# Patient Record
Sex: Female | Born: 1995 | Race: Black or African American | Hispanic: No | Marital: Single | Smoking: Former smoker
Health system: Southern US, Community
[De-identification: ages and names within clinical notes are randomized; demographics above are authoritative.]

## PROBLEM LIST (undated history)

## (undated) DIAGNOSIS — L0293 Carbuncle, unspecified: Secondary | ICD-10-CM

## (undated) DIAGNOSIS — L409 Psoriasis, unspecified: Secondary | ICD-10-CM

## (undated) DIAGNOSIS — L0292 Furuncle, unspecified: Secondary | ICD-10-CM

## (undated) DIAGNOSIS — O139 Gestational [pregnancy-induced] hypertension without significant proteinuria, unspecified trimester: Secondary | ICD-10-CM

## (undated) HISTORY — PX: WISDOM TOOTH EXTRACTION: SHX21

## (undated) HISTORY — DX: Gestational (pregnancy-induced) hypertension without significant proteinuria, unspecified trimester: O13.9

---

## 2014-10-25 ENCOUNTER — Emergency Department (HOSPITAL_COMMUNITY)
Admission: EM | Admit: 2014-10-25 | Discharge: 2014-10-25 | Discharge status: Against Medical Advice | Payer: Self-pay | Attending: Emergency Medicine | Admitting: Emergency Medicine

## 2014-10-25 ENCOUNTER — Encounter (HOSPITAL_COMMUNITY): Payer: Self-pay | Admitting: Emergency Medicine

## 2014-10-25 DIAGNOSIS — S6992XA Unspecified injury of left wrist, hand and finger(s), initial encounter: Secondary | ICD-10-CM | POA: Insufficient documentation

## 2014-10-25 DIAGNOSIS — S6991XA Unspecified injury of right wrist, hand and finger(s), initial encounter: Secondary | ICD-10-CM | POA: Insufficient documentation

## 2014-10-25 DIAGNOSIS — Y9389 Activity, other specified: Secondary | ICD-10-CM | POA: Insufficient documentation

## 2014-10-25 DIAGNOSIS — Y92481 Parking lot as the place of occurrence of the external cause: Secondary | ICD-10-CM | POA: Insufficient documentation

## 2014-10-25 DIAGNOSIS — Y998 Other external cause status: Secondary | ICD-10-CM | POA: Insufficient documentation

## 2014-10-25 MED ORDER — OXYCODONE-ACETAMINOPHEN 5-325 MG PO TABS
1.0000 | ORAL_TABLET | Freq: Once | ORAL | Status: AC
  Administered 2014-10-25: 1 via ORAL
  Filled 2014-10-25: qty 1
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
## 2014-10-25 NOTE — ED Notes (Signed)
Pt's family showed up and they went outside to talk and pt never returned

## 2015-01-02 ENCOUNTER — Emergency Department (HOSPITAL_COMMUNITY)
Admission: EM | Admit: 2015-01-02 | Discharge: 2015-01-02 | Disposition: A | Discharge status: Home / Self Care | Payer: Medicaid Other | Attending: Emergency Medicine | Admitting: Emergency Medicine

## 2015-01-02 ENCOUNTER — Encounter (HOSPITAL_COMMUNITY): Payer: Self-pay | Admitting: *Deleted

## 2015-01-02 DIAGNOSIS — N760 Acute vaginitis: Secondary | ICD-10-CM | POA: Insufficient documentation

## 2015-01-02 DIAGNOSIS — R1030 Lower abdominal pain, unspecified: Secondary | ICD-10-CM

## 2015-01-02 DIAGNOSIS — Z3202 Encounter for pregnancy test, result negative: Secondary | ICD-10-CM | POA: Insufficient documentation

## 2015-01-02 DIAGNOSIS — B9689 Other specified bacterial agents as the cause of diseases classified elsewhere: Secondary | ICD-10-CM

## 2015-01-02 DIAGNOSIS — Z793 Long term (current) use of hormonal contraceptives: Secondary | ICD-10-CM | POA: Insufficient documentation

## 2015-01-02 LAB — COMPREHENSIVE METABOLIC PANEL
ALT: 22 U/L (ref 14–54)
AST: 27 U/L (ref 15–41)
Albumin: 3.5 g/dL (ref 3.5–5.0)
Alkaline Phosphatase: 79 U/L (ref 38–126)
Anion gap: 9 (ref 5–15)
BUN: 7 mg/dL (ref 6–20)
CHLORIDE: 105 mmol/L (ref 101–111)
CO2: 21 mmol/L — ABNORMAL LOW (ref 22–32)
CREATININE: 0.76 mg/dL (ref 0.44–1.00)
Calcium: 9.2 mg/dL (ref 8.9–10.3)
GFR calc Af Amer: >60 mL/min (ref >60)
GLUCOSE: 91 mg/dL (ref 65–99)
POTASSIUM: 4.3 mmol/L (ref 3.5–5.1)
Sodium: 135 mmol/L (ref 135–145)
Total Bilirubin: 0.3 mg/dL (ref 0.3–1.2)
Total Protein: 7.2 g/dL (ref 6.5–8.1)

## 2015-01-02 LAB — CBC WITH DIFFERENTIAL/PLATELET
BASOS PCT: 0 % (ref 0–1)
Basophils Absolute: 0 10*3/uL (ref 0.0–0.1)
EOS PCT: 1 % (ref 0–5)
Eosinophils Absolute: 0.1 10*3/uL (ref 0.0–0.7)
HEMATOCRIT: 38.1 % (ref 36.0–46.0)
HEMOGLOBIN: 12.5 g/dL (ref 12.0–15.0)
Lymphocytes Relative: 52 % — ABNORMAL HIGH (ref 12–46)
Lymphs Abs: 4.8 10*3/uL — ABNORMAL HIGH (ref 0.7–4.0)
MCH: 28.6 pg (ref 26.0–34.0)
MCHC: 32.8 g/dL (ref 30.0–36.0)
MCV: 87.2 fL (ref 78.0–100.0)
MONOS PCT: 10 % (ref 3–12)
Monocytes Absolute: 0.9 10*3/uL (ref 0.1–1.0)
Neutro Abs: 3.4 10*3/uL (ref 1.7–7.7)
Neutrophils Relative %: 37 % — ABNORMAL LOW (ref 43–77)
PLATELETS: 363 10*3/uL (ref 150–400)
RBC: 4.37 MIL/uL (ref 3.87–5.11)
RDW: 13.3 % (ref 11.5–15.5)
WBC: 9.3 10*3/uL (ref 4.0–10.5)

## 2015-01-02 LAB — URINALYSIS, ROUTINE W REFLEX MICROSCOPIC
BILIRUBIN URINE: NEGATIVE
GLUCOSE, UA: NEGATIVE mg/dL
Hgb urine dipstick: NEGATIVE
Ketones, ur: NEGATIVE mg/dL
LEUKOCYTES UA: NEGATIVE
NITRITE: NEGATIVE
Protein, ur: NEGATIVE mg/dL
SPECIFIC GRAVITY, URINE: 1.021 (ref 1.005–1.030)
UROBILINOGEN UA: 1 mg/dL (ref 0.0–1.0)
pH: 7.5 (ref 5.0–8.0)

## 2015-01-02 LAB — LIPASE, BLOOD: LIPASE: 23 U/L (ref 22–51)

## 2015-01-02 LAB — WET PREP, GENITAL
Trich, Wet Prep: NONE SEEN
Yeast Wet Prep HPF POC: NONE SEEN

## 2015-01-02 LAB — POC URINE PREG, ED: Preg Test, Ur: NEGATIVE

## 2015-01-02 MED ORDER — HYDROCODONE-ACETAMINOPHEN 5-325 MG PO TABS
1.0000 | ORAL_TABLET | Freq: Once | ORAL | Status: AC
  Administered 2015-01-02: 1 via ORAL
  Filled 2015-01-02: qty 1

## 2015-01-02 MED ORDER — METRONIDAZOLE 500 MG PO TABS: 500.0000 mg | ORAL_TABLET | Freq: Two times a day (BID) | ORAL | Status: DC

## 2015-01-02 MED ORDER — HYDROCODONE-ACETAMINOPHEN 5-325 MG PO TABS: 1.0000 | ORAL_TABLET | Freq: Four times a day (QID) | ORAL | Status: DC | PRN

## 2015-01-02 MED ORDER — NAPROXEN 500 MG PO TABS: 500.0000 mg | ORAL_TABLET | Freq: Two times a day (BID) | ORAL | Status: DC | PRN

## 2015-01-02 NOTE — ED Provider Notes (Signed)
CSN: [id number]     Arrival date & time 01/02/15  1814 History   First MD Initiated Contact with Patient 01/02/15 1904     Chief Complaint  Patient presents with  . Abdominal Pain     (Consider location/radiation/quality/duration/timing/severity/associated sxs/prior Treatment) HPI Comments: Lana FishBrionna Garden is a 19 y.o. healthy female who presents to the ED with complaints of sudden onset lower abdominal pain that began 2 hours prior to arrival. She reports that the pain is 7/10 sharp, in her lower abdomen, coming intermittently, nonradiating, worse with ambulation, and with no medications tried prior to arrival. She states that it occurred when she went from a seated position to a standing position. Overall it has improved since onset. She denies any fevers, chills, chest pain, shortness breath, nausea, vomiting, diarrhea, constipation, melena, hematochezia, obstipation, dysuria, hematuria, vaginal bleeding or discharge, flank pain, numbness, tingling, weakness, recent travel, suspicious food intake, NSAIDs, sick contacts, or recent antibiotics. She endorses intermittent alcohol use, last drink was on Saturday when she had a glass of wine. LMP was 5/3, she is on birth control. She essentially active with 2 female partners, unprotected. No recent sexual activity.  Patient is a 19 y.o. female presenting with abdominal pain. The history is provided by the patient. No language interpreter was used.  Abdominal Pain Pain location:  LLQ, RLQ and suprapubic Pain quality: sharp   Pain radiates to:  Does not radiate Pain severity:  Moderate Onset quality:  Sudden Duration:  2 hours Timing:  Intermittent Progression:  Improving Chronicity:  New Context: not recent sexual activity, not recent travel, not sick contacts and not suspicious food intake   Relieved by:  None tried Worsened by:  Movement Ineffective treatments:  None tried Associated symptoms: no chest pain, no chills, no constipation, no  diarrhea, no dysuria, no fever, no flatus, no hematemesis, no hematochezia, no hematuria, no melena, no nausea, no shortness of breath, no vaginal bleeding, no vaginal discharge and no vomiting   Risk factors: obesity     History reviewed. No pertinent past medical history. History reviewed. No pertinent past surgical history. No family history on file. History  Substance Use Topics  . Smoking status: Never Smoker   . Smokeless tobacco: Not on file  . Alcohol Use: No   OB History    No data available     Review of Systems  Constitutional: Negative for fever and chills.  Respiratory: Negative for shortness of breath.   Cardiovascular: Negative for chest pain.  Gastrointestinal: Positive for abdominal pain. Negative for nausea, vomiting, diarrhea, constipation, blood in stool, melena, hematochezia, flatus and hematemesis.  Genitourinary: Negative for dysuria, frequency, hematuria, flank pain, vaginal bleeding, vaginal discharge and menstrual problem.  Musculoskeletal: Negative for myalgias, back pain and arthralgias.  Skin: Negative for color change.  Allergic/Immunologic: Negative for immunocompromised state.  Neurological: Negative for weakness and numbness.  Psychiatric/Behavioral: Negative for confusion.   10 Systems reviewed and are negative for acute change except as noted in the HPI.    Allergies  Review of patient's allergies indicates no known allergies.  Home Medications   Prior to Admission medications   Medication Sig Start Date End Date Taking? Authorizing Provider  ibuprofen (ADVIL,MOTRIN) 200 MG tablet Take 200 mg by mouth every 6 (six) hours as needed for mild pain or moderate pain.   Yes Historical Provider, MD  TRINESSA, 28, 0.18/0.215/0.25 MG-35 MCG tablet Take 1 tablet by mouth daily. 12/19/14  Yes Historical Provider, MD   BP 110/78 mmHg  Pulse 89  Temp(Src) 98.2 F (36.8 C) (Oral)  Resp 16  Ht  (1.626 m)  Wt 229 lb (103.874 kg)  BMI 39.29 kg/m2   SpO2 100%  LMP 12/04/2014 Physical Exam  Constitutional: She is oriented to person, place, and time. Vital signs are normal. She appears well-developed and well-nourished.  Non-toxic appearance. No distress.  Afebrile, nontoxic, NAD. Laughing with her friends  HENT:  Head: Normocephalic and atraumatic.  Mouth/Throat: Oropharynx is clear and moist and mucous membranes are normal.  Eyes: Conjunctivae and EOM are normal. Right eye exhibits no discharge. Left eye exhibits no discharge.  Neck: Normal range of motion. Neck supple.  Cardiovascular: Normal rate, regular rhythm, normal heart sounds and intact distal pulses.  Exam reveals no gallop and no friction rub.   No murmur heard. Pulmonary/Chest: Effort normal and breath sounds normal. No respiratory distress. She has no decreased breath sounds. She has no wheezes. She has no rhonchi. She has no rales.  Abdominal: Soft. Normal appearance and bowel sounds are normal. She exhibits no distension. There is tenderness in the right lower quadrant, suprapubic area and left lower quadrant. There is no rigidity, no rebound, no guarding, no CVA tenderness, no tenderness at McBurney's point and negative Murphy's sign.    Soft, obese but nondistended, +BS throughout, with diffuse lower abd tenderness along pelvic brim, no r/g/r, neg murphy's, neg mcburney's, no CVA TTP   Genitourinary: Uterus normal. Pelvic exam was performed with patient supine. There is no rash, tenderness or lesion on the right labia. There is no rash, tenderness or lesion on the left labia. Cervix exhibits no motion tenderness, no discharge and no friability. Right adnexum displays no mass, no tenderness and no fullness. Left adnexum displays no mass, no tenderness and no fullness. No erythema, tenderness or bleeding in the vagina. Vaginal discharge found.  Chaperone present for exam. No rashes, lesions, or tenderness to external genitalia. No erythema, injury, or tenderness to vaginal  mucosa. Mild white vaginal discharge without bleeding within vaginal vault. No adnexal masses, tenderness, or fullness. No CMT, cervical friability, or discharge from cervical os. Uterus non-deviated, mobile, nonTTP, and without enlargement.    Musculoskeletal: Normal range of motion.  Neurological: She is alert and oriented to person, place, and time. She has normal strength. No sensory deficit.  Skin: Skin is warm, dry and intact. No rash noted.  Psychiatric: She has a normal mood and affect.  Nursing note and vitals reviewed.   ED Course  Procedures (including critical care time) Labs Review Labs Reviewed  WET PREP, GENITAL - Abnormal; Notable for the following:    Clue Cells Wet Prep HPF POC MANY (*)    WBC, Wet Prep HPF POC FEW (*)    All other components within normal limits  CBC WITH DIFFERENTIAL/PLATELET - Abnormal; Notable for the following:    Neutrophils Relative % 37 (*)    Lymphocytes Relative 52 (*)    Lymphs Abs 4.8 (*)    All other components within normal limits  COMPREHENSIVE METABOLIC PANEL - Abnormal; Notable for the following:    CO2 21 (*)    All other components within normal limits  URINALYSIS, ROUTINE W REFLEX MICROSCOPIC - Abnormal; Notable for the following:    APPearance CLOUDY (*)    All other components within normal limits  LIPASE, BLOOD  RPR  HIV ANTIBODY (ROUTINE TESTING)  POC URINE PREG, ED  GC/CHLAMYDIA PROBE AMP ([facility])    Imaging Review No results found.  EKG Interpretation None      MDM   Final diagnoses:  Lower abdominal pain  BV (bacterial vaginosis)    19 y.o. female here with lower abd pain since this afternoon at 5pm. No other symptoms. Sexually active, unprotected. On exam, tenderness diffusely along lower abdomen, near pelvic brim. No mcburney's point tenderness. Will perform pelvic exam, get labs, U/A and Upreg, and give pain meds then reassess.   8:35 PM Upreg neg. Lipase WNL. CBC w/diff reassuring and  unremarkable. CMP unremarkable. U/A clear. Pelvic exam with mild white discharge without CMT or pelvic tenderness. Pt is laughing and with reassuring exam, doubt need for imaging. Will await wet prep and see if empiric tx for GC/CT would be indicated based on WBC count there. Will reassess shortly.   9:18 PM Wet prep reveals clue cells c/w BV, few WBCs, doubt need for empiric tx of GC/CT. Will treat with flagyl now and await GC/Ct testing to return. Pt appears well and pain improved, doubt emergent causes of abd pain that need further work up at this time. Will give pain meds for home. Will have her f/up with PCP in 1wk. Strict return precautions given. I explained the diagnosis and have given explicit precautions to return to the ER including for any other new or worsening symptoms. The patient understands and accepts the medical plan as it's been dictated and I have answered their questions. Discharge instructions concerning home care and prescriptions have been given. The patient is STABLE and is discharged to home in good condition.  BP 124/55 mmHg  Pulse 85  Temp(Src) 98.2 F (36.8 C) (Oral)  Resp 16  Ht  (1.626 m)  Wt 229 lb (103.874 kg)  BMI 39.29 kg/m2  SpO2 99%  LMP 12/04/2014  Meds ordered this encounter  Medications  . TRINESSA, 28, 0.18/0.215/0.25 MG-35 MCG tablet    Sig: Take 1 tablet by mouth daily.    Refill:  1  . ibuprofen (ADVIL,MOTRIN) 200 MG tablet    Sig: Take 200 mg by mouth every 6 (six) hours as needed for mild pain or moderate pain.  Marland Kitchen HYDROcodone-acetaminophen (NORCO/VICODIN) 5-325 MG per tablet 1 tablet    Sig:   . metroNIDAZOLE (FLAGYL) 500 MG tablet    Sig: Take 1 tablet (500 mg total) by mouth 2 (two) times daily. One po bid x 7 days    Dispense:  14 tablet    Refill:  0    Order Specific Question:  Supervising Provider    Answer:  Hyacinth Meeker, BRIAN [[id number]]  . naproxen (NAPROSYN) 500 MG tablet    Sig: Take 1 tablet (500 mg total) by mouth 2 (two) times  daily as needed for mild pain, moderate pain or headache (TAKE WITH MEALS.).    Dispense:  20 tablet    Refill:  0    Order Specific Question:  Supervising Provider    Answer:  MILLER, BRIAN [[id number]]  . HYDROcodone-acetaminophen (NORCO) 5-325 MG per tablet    Sig: Take 1 tablet by mouth every 6 (six) hours as needed for severe pain.    Dispense:  6 tablet    Refill:  0    Order Specific Question:  Supervising Provider    Answer:  Eber Hong [[id number]]     Kimesha Claxton Camprubi-Soms, PA-C 01/02/15 2120  Donnetta Hutching, MD 01/04/15 1120

## 2015-01-02 NOTE — Discharge Instructions (Signed)
Your abdominal pain was evaluated today, it does not seem to be anything emergent at this time. You were found to have bacterial vaginosis, take flagyl as directed but DO NOT CONSUME ALCOHOL WHILE TAKING THIS MEDICATION. Stay well hydrated. Use naprosyn and norco as directed as needed for pain, but don't drive while taking norco. Follow up with your regular doctor in one week for recheck. Return to the ER for changes or worsening symptoms.  Abdominal (belly) pain can be caused by many things. Your caregiver performed an examination and possibly ordered blood/urine tests and imaging (CT scan, x-rays, ultrasound). Many cases can be observed and treated at home after initial evaluation in the emergency department. Even though you are being discharged home, abdominal pain can be unpredictable. Therefore, you need a repeated exam if your pain does not resolve, returns, or worsens. Most patients with abdominal pain don't have to be admitted to the hospital or have surgery, but serious problems like appendicitis and gallbladder attacks can start out as nonspecific pain. Many abdominal conditions cannot be diagnosed in one visit, so follow-up evaluations are very important. SEEK IMMEDIATE MEDICAL ATTENTION IF YOU DEVELOP ANY OF THE FOLLOWING SYMPTOMS:  The pain does not go away or becomes severe.   A temperature above 101 develops.   Repeated vomiting occurs (multiple episodes).   The pain becomes localized to portions of the abdomen. The right side could possibly be appendicitis. In an adult, the left lower portion of the abdomen could be colitis or diverticulitis.   Blood is being passed in stools or vomit (bright red or black tarry stools).   Return also if you develop chest pain, difficulty breathing, dizziness or fainting, or become confused, poorly responsive, or inconsolable (young children).  The constipation stays for more than 4 days.   There is belly (abdominal) or rectal pain.   You do not  seem to be getting better.     Abdominal Pain, Women Abdominal (stomach, pelvic, or belly) pain can be caused by many things. It is important to tell your doctor:  The location of the pain.  Does it come and go or is it present all the time?  Are there things that start the pain (eating certain foods, exercise)?  Are there other symptoms associated with the pain (fever, nausea, vomiting, diarrhea)? All of this is helpful to know when trying to find the cause of the pain. CAUSES   Stomach: virus or bacteria infection, or ulcer.  Intestine: appendicitis (inflamed appendix), regional ileitis (Crohn's disease), ulcerative colitis (inflamed colon), irritable bowel syndrome, diverticulitis (inflamed diverticulum of the colon), or cancer of the stomach or intestine.  Gallbladder disease or stones in the gallbladder.  Kidney disease, kidney stones, or infection.  Pancreas infection or cancer.  Fibromyalgia (pain disorder).  Diseases of the female organs:  Uterus: fibroid (non-cancerous) tumors or infection.  Fallopian tubes: infection or tubal pregnancy.  Ovary: cysts or tumors.  Pelvic adhesions (scar tissue).  Endometriosis (uterus lining tissue growing in the pelvis and on the pelvic organs).  Pelvic congestion syndrome (female organs filling up with blood just before the menstrual period).  Pain with the menstrual period.  Pain with ovulation (producing an egg).  Pain with an IUD (intrauterine device, birth control) in the uterus.  Cancer of the female organs.  Functional pain (pain not caused by a disease, may improve without treatment).  Psychological pain.  Depression. DIAGNOSIS  Your doctor will decide the seriousness of your pain by doing an examination.  Blood tests.  X-rays.  Ultrasound.  CT scan (computed tomography, special type of X-ray).  MRI (magnetic resonance imaging).  Cultures, for infection.  Barium enema (dye inserted in the large  intestine, to better view it with X-rays).  Colonoscopy (looking in intestine with a lighted tube).  Laparoscopy (minor surgery, looking in abdomen with a lighted tube).  Major abdominal exploratory surgery (looking in abdomen with a large incision). TREATMENT  The treatment will depend on the cause of the pain.   Many cases can be observed and treated at home.  Over-the-counter medicines recommended by your caregiver.  Prescription medicine.  Antibiotics, for infection.  Birth control pills, for painful periods or for ovulation pain.  Hormone treatment, for endometriosis.  Nerve blocking injections.  Physical therapy.  Antidepressants.  Counseling with a psychologist or psychiatrist.  Minor or major surgery. HOME CARE INSTRUCTIONS   Do not take laxatives, unless directed by your caregiver.  Take over-the-counter pain medicine only if ordered by your caregiver. Do not take aspirin because it can cause an upset stomach or bleeding.  Try a clear liquid diet (broth or water) as ordered by your caregiver. Slowly move to a bland diet, as tolerated, if the pain is related to the stomach or intestine.  Have a thermometer and take your temperature several times a day, and record it.  Bed rest and sleep, if it helps the pain.  Avoid sexual intercourse, if it causes pain.  Avoid stressful situations.  Keep your follow-up appointments and tests, as your caregiver orders.  If the pain does not go away with medicine or surgery, you may try:  Acupuncture.  Relaxation exercises (yoga, meditation).  Group therapy.  Counseling. SEEK MEDICAL CARE IF:   You notice certain foods cause stomach pain.  Your home care treatment is not helping your pain.  You need stronger pain medicine.  You want your IUD removed.  You feel faint or lightheaded.  You develop nausea and vomiting.  You develop a rash.  You are having side effects or an allergy to your medicine. SEEK  IMMEDIATE MEDICAL CARE IF:   Your pain does not go away or gets worse.  You have a fever.  Your pain is felt only in portions of the abdomen. The right side could possibly be appendicitis. The left lower portion of the abdomen could be colitis or diverticulitis.  You are passing blood in your stools (bright red or black tarry stools, with or without vomiting).  You have blood in your urine.  You develop chills, with or without a fever.  You pass out. MAKE SURE YOU:   Understand these instructions.  Will watch your condition.  Will get help right away if you are not doing well or get worse. Document Released: 05/26/2007 Document Revised: 12/13/2013 Document Reviewed: 06/15/2009 [facility] Patient Information 2015 [place], [place]. This information is not intended to replace advice given to you by your health care provider. Make sure you discuss any questions you have with your health care provider.  Bacterial Vaginosis Bacterial vaginosis is a vaginal infection that occurs when the normal balance of bacteria in the vagina is disrupted. It results from an overgrowth of certain bacteria. This is the most common vaginal infection in women of childbearing age. Treatment is important to prevent complications, especially in pregnant women, as it can cause a premature delivery. CAUSES  Bacterial vaginosis is caused by an increase in harmful bacteria that are normally present in smaller amounts in the vagina. Several different  kinds of bacteria can cause bacterial vaginosis. However, the reason that the condition develops is not fully understood. RISK FACTORS Certain activities or behaviors can put you at an increased risk of developing bacterial vaginosis, including:  Having a new sex partner or multiple sex partners.  Douching.  Using an intrauterine device (IUD) for contraception. Women do not get bacterial vaginosis from toilet seats, bedding, swimming pools, or contact with objects  around them. SIGNS AND SYMPTOMS  Some women with bacterial vaginosis have no signs or symptoms. Common symptoms include:  Grey vaginal discharge.  A fishlike odor with discharge, especially after sexual intercourse.  Itching or burning of the vagina and vulva.  Burning or pain with urination. DIAGNOSIS  Your health care provider will take a medical history and examine the vagina for signs of bacterial vaginosis. A sample of vaginal fluid may be taken. Your health care provider will look at this sample under a microscope to check for bacteria and abnormal cells. A vaginal pH test may also be done.  TREATMENT  Bacterial vaginosis may be treated with antibiotic medicines. These may be given in the form of a pill or a vaginal cream. A second round of antibiotics may be prescribed if the condition comes back after treatment.  HOME CARE INSTRUCTIONS   Only take over-the-counter or prescription medicines as directed by your health care provider.  If antibiotic medicine was prescribed, take it as directed. Make sure you finish it even if you start to feel better.  Do not have sex until treatment is completed.  Tell all sexual partners that you have a vaginal infection. They should see their health care provider and be treated if they have problems, such as a mild rash or itching.  Practice safe sex by using condoms and only having one sex partner. SEEK MEDICAL CARE IF:   Your symptoms are not improving after 3 days of treatment.  You have increased discharge or pain.  You have a fever. MAKE SURE YOU:   Understand these instructions.  Will watch your condition.  Will get help right away if you are not doing well or get worse. FOR MORE INFORMATION  Centers for Disease Control and Prevention, Division of STD Prevention: SolutionApps.co.zawww.cdc.gov/std American Sexual Health Association (ASHA): www.ashastd.org  Document Released: 07/29/2005 Document Revised: 05/19/2013 Document Reviewed:  03/10/2013 [facility] Patient Information 2015 [place]. This information is not intended to replace advice given to you by your health care provider. Make sure you discuss any questions you have with your health care provider.

## 2015-01-02 NOTE — ED Notes (Signed)
Pt states lower abdominal pain since yesterday am.  Describes pain as sharp and pain increases with ambulation.  Denies changes in bowel and bladder habits or vaginal discharge.

## 2015-01-03 LAB — GC/CHLAMYDIA PROBE AMP (~~LOC~~) NOT AT ARMC
CHLAMYDIA, DNA PROBE: NEGATIVE
Neisseria Gonorrhea: NEGATIVE

## 2015-01-03 LAB — HIV ANTIBODY (ROUTINE TESTING W REFLEX): HIV SCREEN 4TH GENERATION: NONREACTIVE

## 2015-01-03 LAB — RPR: RPR: NONREACTIVE

## 2015-03-05 ENCOUNTER — Emergency Department (HOSPITAL_COMMUNITY)
Admission: EM | Admit: 2015-03-05 | Discharge: 2015-03-05 | Disposition: A | Discharge status: Home / Self Care | Payer: Medicaid Other | Attending: Emergency Medicine | Admitting: Emergency Medicine

## 2015-03-05 ENCOUNTER — Encounter (HOSPITAL_COMMUNITY): Payer: Self-pay | Admitting: Nurse Practitioner

## 2015-03-05 DIAGNOSIS — E669 Obesity, unspecified: Secondary | ICD-10-CM | POA: Insufficient documentation

## 2015-03-05 DIAGNOSIS — L02411 Cutaneous abscess of right axilla: Secondary | ICD-10-CM

## 2015-03-05 DIAGNOSIS — Z79899 Other long term (current) drug therapy: Secondary | ICD-10-CM | POA: Insufficient documentation

## 2015-03-05 MED ORDER — LIDOCAINE HCL 2 % EX GEL
1.0000 "application " | Freq: Once | CUTANEOUS | Status: AC
  Administered 2015-03-05: 1 via TOPICAL
  Filled 2015-03-05: qty 20

## 2015-03-05 MED ORDER — LIDOCAINE HCL (PF) 1 % IJ SOLN
INTRAMUSCULAR | Status: AC
  Filled 2015-03-05: qty 5

## 2015-03-05 MED ORDER — LIDOCAINE HCL (PF) 1 % IJ SOLN
5.0000 mL | Freq: Once | INTRAMUSCULAR | Status: AC
  Administered 2015-03-05: 5 mL

## 2015-03-05 MED ORDER — LIDOCAINE HCL (PF) 1 % IJ SOLN
5.0000 mL | Freq: Once | INTRAMUSCULAR | Status: AC
  Administered 2015-03-05: 5 mL
  Filled 2015-03-05: qty 5

## 2015-03-05 MED ORDER — SULFAMETHOXAZOLE-TRIMETHOPRIM 800-160 MG PO TABS: 1.0000 | ORAL_TABLET | Freq: Two times a day (BID) | ORAL | Status: AC

## 2015-03-05 MED ORDER — OXYCODONE-ACETAMINOPHEN 5-325 MG PO TABS
1.0000 | ORAL_TABLET | Freq: Once | ORAL | Status: AC
  Administered 2015-03-05: 1 via ORAL
  Filled 2015-03-05: qty 1

## 2015-03-05 MED ORDER — HYDROCODONE-ACETAMINOPHEN 5-325 MG PO TABS: 1.0000 | ORAL_TABLET | Freq: Four times a day (QID) | ORAL | Status: DC | PRN

## 2015-03-05 NOTE — ED Notes (Addendum)
She c/o painful abscess under R armpit x 8 days.  Shes been taking BC powder with some pain relief

## 2015-03-05 NOTE — ED Provider Notes (Signed)
CSN: [id number]     Arrival date & time 03/05/15  1612 History  This chart was scribed for Kerrie Buffalo, NP working with Pricilla Loveless, MD by Placido Sou, ED Scribe. This patient was seen in room TR11C/TR11C and the patient's care was started at 4:48 PM.    Chief Complaint  Patient presents with  . Skin Problem   The history is provided by the patient. No language interpreter was used.    HPI Comments: Carly Atkins is a 19 y.o. female, with a hx of abscesses, who presents to the Emergency Department complaining of a point of swelling and moderate, constant, pain to her right underarm with onset 8 days ago. Pt notes that she typically gets abscesses 2x per year and she has been applying a heated compress to the affected area with no relief of her pain or discharge from the affected area. She denies taking any medications for the pain. She denies fever, chills, n/v.  History reviewed. No pertinent past medical history. History reviewed. No pertinent past surgical history. History reviewed. No pertinent family history. History  Substance Use Topics  . Smoking status: Never Smoker   . Smokeless tobacco: Not on file  . Alcohol Use: Yes   OB History    No data available     Review of Systems  Constitutional: Negative for fever and chills.  Gastrointestinal: Negative for nausea and vomiting.  Musculoskeletal: Positive for myalgias.       Abscess right axilla  Skin: Positive for color change.  All other systems reviewed and are negative.  Allergies  Review of patient's allergies indicates no known allergies.  Home Medications   Prior to Admission medications   Medication Sig Start Date End Date Taking? Authorizing Provider  HYDROcodone-acetaminophen (NORCO) 5-325 MG per tablet Take 1 tablet by mouth every 6 (six) hours as needed. 03/05/15   Lucah Petta Orlene Och, NP  ibuprofen (ADVIL,MOTRIN) 200 MG tablet Take 200 mg by mouth every 6 (six) hours as needed for mild pain or moderate pain.     Historical Provider, MD  metroNIDAZOLE (FLAGYL) 500 MG tablet Take 1 tablet (500 mg total) by mouth 2 (two) times daily. One po bid x 7 days 01/02/15   Mercedes Camprubi-Soms, PA-C  naproxen (NAPROSYN) 500 MG tablet Take 1 tablet (500 mg total) by mouth 2 (two) times daily as needed for mild pain, moderate pain or headache (TAKE WITH MEALS.). 01/02/15   Mercedes Camprubi-Soms, PA-C  sulfamethoxazole-trimethoprim (BACTRIM DS,SEPTRA DS) 800-160 MG per tablet Take 1 tablet by mouth 2 (two) times daily. 03/05/15 03/12/15  Malayzia Laforte Orlene Och, NP  TRINESSA, 28, 0.18/0.215/0.25 MG-35 MCG tablet Take 1 tablet by mouth daily. 12/19/14   Historical Provider, MD   BP 119/89 mmHg  Pulse 88  Temp(Src) 98.2 F (36.8 C) (Oral)  Resp 18  Ht  (1.626 m)  Wt 226 lb 1.6 oz (102.558 kg)  BMI 38.79 kg/m2  SpO2 100% Physical Exam  Constitutional: She is oriented to person, place, and time. No distress.  Obese  HENT:  Head: Normocephalic and atraumatic.  Mouth/Throat: Oropharynx is clear and moist.  Eyes: Conjunctivae and EOM are normal. Pupils are equal, round, and reactive to light.  Neck: Normal range of motion. Neck supple. No tracheal deviation present.  Cardiovascular: Normal rate.   Pulmonary/Chest: Breath sounds normal. No respiratory distress.  Abdominal: Soft.  Musculoskeletal: Normal range of motion.  Neurological: She is alert and oriented to person, place, and time.  Skin: Skin is warm  and dry.  4 cm raised area that is tender with fluctuance right axilla  Psychiatric: She has a normal mood and affect. Her behavior is normal.  Nursing note and vitals reviewed.   ED Course  INCISION AND DRAINAGE Date/Time: 03/05/2015 5:45 PM Performed by: Janne Napoleon Authorized by: Janne Napoleon Consent: Verbal consent obtained. Risks and benefits: risks, benefits and alternatives were discussed Consent given by: patient Patient understanding: patient states understanding of the procedure being  performed Required items: required blood products, implants, devices, and special equipment available Patient identity confirmed: verbally with patient Type: abscess Body area: upper extremity (right axilla) Anesthesia: local infiltration Local anesthetic: lidocaine 1% without epinephrine Anesthetic total: 5 ml Patient sedated: no Scalpel size: 11 Needle gauge: 22 Incision type: single straight Complexity: simple Drainage: purulent Drainage amount: moderate Wound treatment: wound left open Patient tolerance: Patient tolerated the procedure well with no immediate complications    DIAGNOSTIC STUDIES: Oxygen Saturation is 99% on RA, normal by my interpretation.    COORDINATION OF CARE: 4:51 PM Discussed treatment plan with pt at bedside and pt agreed to plan.  MDM  19 y.o. obese female with abscess to right axilla that has been worsening over the past 8 days. Stable for d/c without fever and does not appear toxic. Patient reports significant relief with I&D. She will return as needed for any problems. She will apply warm wet compresses to the area to help assist with continued drainage.   Final diagnoses:  Abscess of right axilla   I personally performed the services described in this documentation, which was scribed in my presence. The recorded information has been reviewed and is accurate.   [address] [place], [place] 03/06/15 1610  Pricilla Loveless, MD 03/08/15 [phone]

## 2015-03-05 NOTE — Discharge Instructions (Signed)
Apply warm wet compresses to the area to help the infection to continue to drain. Do not drive while taking the narcotic as it will make you sleepy.  Abscess An abscess is an infected area that contains a collection of pus and debris.It can occur in almost any part of the body. An abscess is also known as a furuncle or boil. CAUSES  An abscess occurs when tissue gets infected. This can occur from blockage of oil or sweat glands, infection of hair follicles, or a minor injury to the skin. As the body tries to fight the infection, pus collects in the area and creates pressure under the skin. This pressure causes pain. People with weakened immune systems have difficulty fighting infections and get certain abscesses more often.  SYMPTOMS Usually an abscess develops on the skin and becomes a painful mass that is red, warm, and tender. If the abscess forms under the skin, you may feel a moveable soft area under the skin. Some abscesses break open (rupture) on their own, but most will continue to get worse without care. The infection can spread deeper into the body and eventually into the bloodstream, causing you to feel ill.  DIAGNOSIS  Your caregiver will take your medical history and perform a physical exam. A sample of fluid may also be taken from the abscess to determine what is causing your infection. TREATMENT  Your caregiver may prescribe antibiotic medicines to fight the infection. However, taking antibiotics alone usually does not cure an abscess. Your caregiver may need to make a small cut (incision) in the abscess to drain the pus. In some cases, gauze is packed into the abscess to reduce pain and to continue draining the area. HOME CARE INSTRUCTIONS   Only take over-the-counter or prescription medicines for pain, discomfort, or fever as directed by your caregiver.  If you were prescribed antibiotics, take them as directed. Finish them even if you start to feel better.  If gauze is used,  follow your caregiver's directions for changing the gauze.  To avoid spreading the infection:  Keep your draining abscess covered with a bandage.  Wash your hands well.  Do not share personal care items, towels, or whirlpools with others.  Avoid skin contact with others.  Keep your skin and clothes clean around the abscess.  Keep all follow-up appointments as directed by your caregiver. SEEK MEDICAL CARE IF:   You have increased pain, swelling, redness, fluid drainage, or bleeding.  You have muscle aches, chills, or a general ill feeling.  You have a fever. MAKE SURE YOU:   Understand these instructions.  Will watch your condition.  Will get help right away if you are not doing well or get worse. Document Released: 05/08/2005 Document Revised: 01/28/2012 Document Reviewed: 10/11/2011 [facility] Patient Information 2015 [place], [place]. This information is not intended to replace advice given to you by your health care provider. Make sure you discuss any questions you have with your health care provider.

## 2015-03-08 ENCOUNTER — Emergency Department (HOSPITAL_COMMUNITY)
Admission: EM | Admit: 2015-03-08 | Discharge: 2015-03-09 | Disposition: A | Discharge status: Home / Self Care | Payer: Medicaid Other | Attending: Emergency Medicine | Admitting: Emergency Medicine

## 2015-03-08 ENCOUNTER — Encounter (HOSPITAL_COMMUNITY): Payer: Self-pay | Admitting: Emergency Medicine

## 2015-03-08 DIAGNOSIS — L0291 Cutaneous abscess, unspecified: Secondary | ICD-10-CM

## 2015-03-08 DIAGNOSIS — L02411 Cutaneous abscess of right axilla: Secondary | ICD-10-CM | POA: Insufficient documentation

## 2015-03-08 DIAGNOSIS — Z79899 Other long term (current) drug therapy: Secondary | ICD-10-CM | POA: Insufficient documentation

## 2015-03-08 DIAGNOSIS — L0390 Cellulitis, unspecified: Secondary | ICD-10-CM

## 2015-03-08 HISTORY — DX: Carbuncle, unspecified: L02.93

## 2015-03-08 HISTORY — DX: Furuncle, unspecified: L02.92

## 2015-03-08 LAB — CBC WITH DIFFERENTIAL/PLATELET
BASOS ABS: 0 10*3/uL (ref 0.0–0.1)
Basophils Relative: 0 % (ref 0–1)
Eosinophils Absolute: 0.2 10*3/uL (ref 0.0–0.7)
Eosinophils Relative: 2 % (ref 0–5)
HEMATOCRIT: 33.8 % — AB (ref 36.0–46.0)
Hemoglobin: 11.6 g/dL — ABNORMAL LOW (ref 12.0–15.0)
LYMPHS PCT: 22 % (ref 12–46)
Lymphs Abs: 1.8 10*3/uL (ref 0.7–4.0)
MCH: 29.3 pg (ref 26.0–34.0)
MCHC: 34.3 g/dL (ref 30.0–36.0)
MCV: 85.4 fL (ref 78.0–100.0)
MONOS PCT: 14 % — AB (ref 3–12)
Monocytes Absolute: 1.1 10*3/uL — ABNORMAL HIGH (ref 0.1–1.0)
NEUTROS PCT: 62 % (ref 43–77)
Neutro Abs: 5 10*3/uL (ref 1.7–7.7)
Platelets: 378 10*3/uL (ref 150–400)
RBC: 3.96 MIL/uL (ref 3.87–5.11)
RDW: 13.2 % (ref 11.5–15.5)
WBC: 8.1 10*3/uL (ref 4.0–10.5)

## 2015-03-08 LAB — I-STAT CG4 LACTIC ACID, ED: LACTIC ACID, VENOUS: 0.69 mmol/L (ref 0.5–2.0)

## 2015-03-08 MED ORDER — LIDOCAINE-EPINEPHRINE (PF) 2 %-1:200000 IJ SOLN
20.0000 mL | Freq: Once | INTRAMUSCULAR | Status: AC
  Administered 2015-03-08: 20 mL via INTRADERMAL
  Filled 2015-03-08: qty 20

## 2015-03-08 MED ORDER — ACETAMINOPHEN 325 MG PO TABS
975.0000 mg | ORAL_TABLET | Freq: Once | ORAL | Status: AC
  Administered 2015-03-08: 975 mg via ORAL
  Filled 2015-03-08: qty 3

## 2015-03-08 MED ORDER — SODIUM CHLORIDE 0.9 % IV BOLUS (SEPSIS)
2000.0000 mL | Freq: Once | INTRAVENOUS | Status: AC
  Administered 2015-03-08: 2000 mL via INTRAVENOUS

## 2015-03-08 MED ORDER — CLINDAMYCIN PHOSPHATE 600 MG/50ML IV SOLN
600.0000 mg | Freq: Once | INTRAVENOUS | Status: AC
  Administered 2015-03-08: 600 mg via INTRAVENOUS
  Filled 2015-03-08: qty 50

## 2015-03-08 NOTE — ED Provider Notes (Signed)
CSN: [id number]     Arrival date & time 03/08/15  2152 History  This chart was scribed for Carly Emery, PA-C, working with Dione Booze, MD by Chestine Spore, ED Scribe. The patient was seen in room TR03C/TR03C at 10:42 PM.    Chief Complaint  Patient presents with  . Abscess    The history is provided by the patient. No language interpreter was used.    Carly Atkins is a 19 y.o. female with a medical hx of recurrent abscess who presents to the Emergency Department complaining of abscess onset last week. Pt was seen in the ED for her symptoms and was Rx SeptraDS and Norco with no improvement of her symptoms. Pt is having associated symptoms of warmth and tactile fever, she had a single episode of nonbloody, nonbilious, no coffee-ground emesis today. She denies dysuria, hematuria, urinary frequency, abnormal vaginal discharge, focal abdominal pain, cough, shortness of breath, rhinorrhea. Patient states that she has frequent axillary abscesses. His never required hospitalizations or surgeries.   Past Medical History  Diagnosis Date  . Recurrent boils    History reviewed. No pertinent past surgical history. No family history on file. History  Substance Use Topics  . Smoking status: Never Smoker   . Smokeless tobacco: Not on file  . Alcohol Use: Yes   OB History    No data available     Review of Systems  Constitutional: Positive for fever.  Skin: Positive for color change.       Abscess under right armpit with warmth      Allergies  Review of patient's allergies indicates no known allergies.  Home Medications   Prior to Admission medications   Medication Sig Start Date End Date Taking? Authorizing Provider  cephALEXin (KEFLEX) 500 MG capsule Take 1 capsule (500 mg total) by mouth 4 (four) times daily. 03/09/15   Nahum Sherrer, PA-C  HYDROcodone-acetaminophen (NORCO) 5-325 MG per tablet Take 1 tablet by mouth every 6 (six) hours as needed. 03/05/15   Hope Orlene Och, NP   ibuprofen (ADVIL,MOTRIN) 200 MG tablet Take 200 mg by mouth every 6 (six) hours as needed for mild pain or moderate pain.    Historical Provider, MD  metroNIDAZOLE (FLAGYL) 500 MG tablet Take 1 tablet (500 mg total) by mouth 2 (two) times daily. One po bid x 7 days 01/02/15   Mercedes Camprubi-Soms, PA-C  naproxen (NAPROSYN) 500 MG tablet Take 1 tablet (500 mg total) by mouth 2 (two) times daily as needed for mild pain, moderate pain or headache (TAKE WITH MEALS.). 01/02/15   Mercedes Camprubi-Soms, PA-C  ondansetron (ZOFRAN) 4 MG tablet Take 1 tablet (4 mg total) by mouth every 8 (eight) hours as needed for nausea or vomiting. 03/09/15   Joni Reining Klani Caridi, PA-C  sulfamethoxazole-trimethoprim (BACTRIM DS,SEPTRA DS) 800-160 MG per tablet Take 1 tablet by mouth 2 (two) times daily. 03/05/15 03/12/15  Hope Orlene Och, NP  TRINESSA, 28, 0.18/0.215/0.25 MG-35 MCG tablet Take 1 tablet by mouth daily. 12/19/14   Historical Provider, MD   BP 129/70 mmHg  Pulse 110  Temp(Src) 99.1 F (37.3 C) (Oral)  Resp 18  Ht 5\' 4"  (1.626 m)  Wt 226 lb (102.513 kg)  BMI 38.77 kg/m2  SpO2 100%  LMP 02/09/2015 Physical Exam  Constitutional: She is oriented to person, place, and time. She appears well-developed and well-nourished. No distress.  HENT:  Head: Normocephalic and atraumatic.  Mouth/Throat: Oropharynx is clear and moist.  Eyes: Conjunctivae and EOM are normal. Pupils are  equal, round, and reactive to light.  Neck: Normal range of motion. Neck supple. No tracheal deviation present.  Cardiovascular: Normal rate, regular rhythm and intact distal pulses.   Pulmonary/Chest: Effort normal and breath sounds normal. No stridor. No respiratory distress. She has no wheezes. She has no rales.  Abdominal: Soft.  Musculoskeletal: Normal range of motion.  Neurological: She is alert and oriented to person, place, and time.  Skin: Skin is warm and dry.  6 cm fluctuant abscess with no active drainage to right axilla, moderate  amount of scant surrounding cellulitis.  Psychiatric: She has a normal mood and affect. Her behavior is normal.  Nursing note and vitals reviewed.   ED Course  Procedures (including critical care time) DIAGNOSTIC STUDIES: Oxygen Saturation is 99% on RA, nl by my interpretation.    COORDINATION OF CARE: 11:04 PM-Discussed treatment plan which includes I&D, IV abx, with pt at bedside and pt agreed to plan.    Labs Review Labs Reviewed  CBC WITH DIFFERENTIAL/PLATELET - Abnormal; Notable for the following:    Hemoglobin 11.6 (*)    HCT 33.8 (*)    Monocytes Relative 14 (*)    Monocytes Absolute 1.1 (*)    All other components within normal limits  CULTURE, BLOOD (ROUTINE X 2)  CULTURE, BLOOD (ROUTINE X 2)  I-STAT BETA HCG BLOOD, ED ([facility], [facility], [facility] ONLY)  I-STAT CG4 LACTIC ACID, ED    Imaging Review No results found.   EKG Interpretation None      MDM   Final diagnoses:  Abscess and cellulitis   Filed Vitals:   03/08/15 2205 03/09/15 0102  BP: 110/71 129/70  Pulse: 84 110  Temp: 102.4 F (39.1 C) 99.1 F (37.3 C)  TempSrc: Oral Oral  Resp: 22 18  Height: 5\' 4"  (1.626 m)   Weight: 226 lb (102.513 kg)   SpO2: 99% 100%    Medications  acetaminophen (TYLENOL) tablet 975 mg (975 mg Oral Given 03/08/15 2308)  sodium chloride 0.9 % bolus 2,000 mL (0 mLs Intravenous Stopped 03/09/15 0053)  clindamycin (CLEOCIN) IVPB 600 mg (0 mg Intravenous Stopped 03/09/15 0053)  lidocaine-EPINEPHrine (XYLOCAINE W/EPI) 2 %-1:200000 (PF) injection 20 mL (20 mLs Intradermal Given 03/08/15 2249)    Carly Atkins is a pleasant 19 y.o. female presenting with recurrent axillary abscess. Pt was seen an I and D'd several days ago but wound closed and pt became febrile today. I and D performed by PA Neva Seat with a large volume of drainage and Pt feels much better.  No symptoms suggestive of other source of fever. Blood work reassuring with normal lactic acid no leukocytosis. Offered patient  admission as she has failed outpatient treatment but she's declined. Wound is packed and she is to come back to the ED for any worsening symptoms and to have packing removed in 2 days. Patient is advised to continue to take the Bactrim and will add on Keflex, she did have an episode of emesis this morning but she's tolerating by mouth's now. Will add on Zofran as needed. Advised her to take Motrin every 4-6 hours for fever control. Patient will be bolused 2 L in the ED.  Evaluation does not show pathology that would require ongoing emergent intervention or inpatient treatment. Pt is hemodynamically stable and mentating appropriately. Discussed findings and plan with patient/guardian, who agrees with care plan. All questions answered. Return precautions discussed and outpatient follow up given.   New Prescriptions   CEPHALEXIN (KEFLEX) 500 MG CAPSULE  Take 1 capsule (500 mg total) by mouth 4 (four) times daily.   ONDANSETRON (ZOFRAN) 4 MG TABLET    Take 1 tablet (4 mg total) by mouth every 8 (eight) hours as needed for nausea or vomiting.   I personally performed the services described in this documentation, which was scribed in my presence. The recorded information has been reviewed and is accurate.   Carly Emery, PA-C 03/09/15 0120  Dione Booze, MD 03/09/15 [phone]

## 2015-03-08 NOTE — ED Notes (Signed)
Pt st's was seen here 4 days ago with abscess under right arm.  Pt had I&D at that time but area did not drain anymore.  Pt st's area started to get big again and painful, today pt has had elevated temp with nausea.

## 2015-03-08 NOTE — ED Notes (Signed)
Pt. reports  abscess at right axilla onset last week , seen here 3 days ago prescribed with SeptraDS and Norco with no improvement .

## 2015-03-08 NOTE — ED Provider Notes (Signed)
INCISION AND DRAINAGE Performed by: Dorthula Matas Consent: Verbal consent obtained. Risks and benefits: risks, benefits and alternatives were discussed Type: abscess  Body area: right axilla Anesthesia: local infiltration  Incision was made with a scalpel.  Local anesthetic: lidocaine 2 % with epinephrine  Anesthetic total: 4 ml  Complexity: complex Blunt dissection to break up loculations  Drainage: purulent  Drainage amount: 30 cc's  Packing material: 1/4 in iodoform gauze  Patient tolerance: Patient tolerated the procedure well with no immediate complications.  For HPI, ROS, PE, Plan/dispo please refer to [facility], PA-C note from this visit.    Carly Pel, PA-C 03/08/15 1610  Azalia Bilis, MD 03/09/15 [phone]

## 2015-03-09 MED ORDER — ONDANSETRON HCL 4 MG PO TABS: 4.0000 mg | ORAL_TABLET | Freq: Three times a day (TID) | ORAL | Status: DC | PRN

## 2015-03-09 MED ORDER — CEPHALEXIN 500 MG PO CAPS: 500.0000 mg | ORAL_CAPSULE | Freq: Four times a day (QID) | ORAL | Status: DC

## 2015-03-09 NOTE — Discharge Instructions (Signed)
For fever and pain control, please take Ibuprofen (also known as Motrin or Advil)  (this is normally 2 over the counter pills) every 6 hours. Take with food to minimize stomach irritation.  Continue to take the Bactrim and take Keflex in addition.  Take your antibiotics as directed and to completion. You should never have any leftover antibiotics! Push fluids and stay well hydrated.   Any antibiotic use can reduce the efficacy of hormonal birth control. Please use back up method of contraception.   If you see worsening signs of infection (warmth, redness, tenderness, pus, sharp increase in pain, fever, red streaking) immediately return to the emergency department.   Abscess Care After An abscess (also called a boil or furuncle) is an infected area that contains a collection of pus. Signs and symptoms of an abscess include pain, tenderness, redness, or hardness, or you may feel a moveable soft area under your skin. An abscess can occur anywhere in the body. The infection may spread to surrounding tissues causing cellulitis. A cut (incision) by the surgeon was made over your abscess and the pus was drained out. Gauze may have been packed into the space to provide a drain that will allow the cavity to heal from the inside outwards. The boil may be painful for 5 to 7 days. Most people with a boil do not have high fevers. Your abscess, if seen early, may not have localized, and may not have been lanced. If not, another appointment may be required for this if it does not get better on its own or with medications. HOME CARE INSTRUCTIONS   Only take over-the-counter or prescription medicines for pain, discomfort, or fever as directed by your caregiver.  When you bathe, soak and then remove gauze or iodoform packs at least daily or as directed by your caregiver. You may then wash the wound gently with mild soapy water. Repack with gauze or do as your caregiver directs. SEEK IMMEDIATE MEDICAL CARE IF:     You develop increased pain, swelling, redness, drainage, or bleeding in the wound site.  You develop signs of generalized infection including muscle aches, chills, fever, or a general ill feeling.  An oral temperature above 102 F (38.9 C) develops, not controlled by medication. See your caregiver for a recheck if you develop any of the symptoms described above. If medications (antibiotics) were prescribed, take them as directed. Document Released: 02/14/2005 Document Revised: 10/21/2011 Document Reviewed: 10/12/2007 [facility] Patient Information 2015 [place], [place]. This information is not intended to replace advice given to you by your health care provider. Make sure you discuss any questions you have with your health care provider.

## 2015-03-10 ENCOUNTER — Encounter (HOSPITAL_COMMUNITY): Payer: Self-pay | Admitting: Emergency Medicine

## 2015-03-10 ENCOUNTER — Emergency Department (HOSPITAL_COMMUNITY)
Admission: EM | Admit: 2015-03-10 | Discharge: 2015-03-10 | Disposition: A | Discharge status: Home / Self Care | Payer: Medicaid Other | Attending: Emergency Medicine | Admitting: Emergency Medicine

## 2015-03-10 DIAGNOSIS — Z5189 Encounter for other specified aftercare: Secondary | ICD-10-CM

## 2015-03-10 DIAGNOSIS — Z79899 Other long term (current) drug therapy: Secondary | ICD-10-CM | POA: Insufficient documentation

## 2015-03-10 DIAGNOSIS — Z872 Personal history of diseases of the skin and subcutaneous tissue: Secondary | ICD-10-CM | POA: Insufficient documentation

## 2015-03-10 DIAGNOSIS — Z4801 Encounter for change or removal of surgical wound dressing: Secondary | ICD-10-CM | POA: Insufficient documentation

## 2015-03-10 MED ORDER — PROMETHAZINE HCL 25 MG PO TABS: 25.0000 mg | ORAL_TABLET | Freq: Four times a day (QID) | ORAL | Status: DC | PRN

## 2015-03-10 NOTE — ED Notes (Signed)
Pt. is here for removal of I and D packing at her right axilla .

## 2015-03-10 NOTE — Discharge Instructions (Signed)
1. Medications: phenergan for nausea and vomiting, usual home medications 2. Treatment: rest, drink plenty of fluids, use warm compresses,  3. Follow Up: Please followup here in the ED in 2-3 days for packing removal and wound check; Please return sooner to the ER for fevers, chills, nausea, vomiting or other signs of worsening infection

## 2015-03-10 NOTE — ED Provider Notes (Signed)
CSN: [id number]     Arrival date & time 03/10/15  2255 History  This chart was scribed for Carly Forth, PA-C, working with Blake Divine, MD by Chestine Spore, ED Scribe. The patient was seen in room TR05C/TR05C at 11:22 PM.    Chief Complaint  Patient presents with  . Wound Check      The history is provided by the patient and medical records. No language interpreter was used.    Carly Atkins is a 19 y.o. female with a medical hx of recurrent boils who presents to the Emergency Department complaining of wound check. Pt notes that she had an abscess to her right axilla that was I&D on 03/05/15 and again 03/08/15. Pt reports that she has still has a large amount of drainage from the site but the swelling has gone down. She denies fevers at home.  Pt reports that she is now able to lift up her right arm without significant pain. Pt states that she changed her gauze twice today because of the drainage. Pt reports that the abx that she is on is causing vomiting and she is very nauseous. She reports she is taking it on an empty stomach.  She denies fever, chills, vomiting, and any other symptoms. Pt notes that she is allergic to zofran and her skin turns red.    Past Medical History  Diagnosis Date  . Recurrent boils    History reviewed. No pertinent past surgical history. No family history on file. History  Substance Use Topics  . Smoking status: Never Smoker   . Smokeless tobacco: Not on file  . Alcohol Use: Yes   OB History    No data available     Review of Systems  Constitutional: Negative for fever and chills.  Gastrointestinal: Negative for nausea and vomiting.  Endocrine: Negative for polydipsia, polyphagia and polyuria.  Skin: Positive for wound.       Abscess  Allergic/Immunologic: Negative for immunocompromised state.  Hematological: Does not bruise/bleed easily.  Psychiatric/Behavioral: The patient is not nervous/anxious.       Allergies  Zofran  Home  Medications   Prior to Admission medications   Medication Sig Start Date End Date Taking? Authorizing Provider  cephALEXin (KEFLEX) 500 MG capsule Take 1 capsule (500 mg total) by mouth 4 (four) times daily. 03/09/15   Nicole Pisciotta, PA-C  HYDROcodone-acetaminophen (NORCO) 5-325 MG per tablet Take 1 tablet by mouth every 6 (six) hours as needed. 03/05/15   Hope Orlene Och, NP  ibuprofen (ADVIL,MOTRIN) 200 MG tablet Take 200 mg by mouth every 6 (six) hours as needed for mild pain or moderate pain.    Historical Provider, MD  metroNIDAZOLE (FLAGYL) 500 MG tablet Take 1 tablet (500 mg total) by mouth 2 (two) times daily. One po bid x 7 days 01/02/15   Mercedes Camprubi-Soms, PA-C  naproxen (NAPROSYN) 500 MG tablet Take 1 tablet (500 mg total) by mouth 2 (two) times daily as needed for mild pain, moderate pain or headache (TAKE WITH MEALS.). 01/02/15   Mercedes Camprubi-Soms, PA-C  ondansetron (ZOFRAN) 4 MG tablet Take 1 tablet (4 mg total) by mouth every 8 (eight) hours as needed for nausea or vomiting. 03/09/15   Joni Reining Pisciotta, PA-C  promethazine (PHENERGAN) 25 MG tablet Take 1 tablet (25 mg total) by mouth every 6 (six) hours as needed for nausea or vomiting. 03/10/15   Dahlia Client Drake Wuertz, PA-C  sulfamethoxazole-trimethoprim (BACTRIM DS,SEPTRA DS) 800-160 MG per tablet Take 1 tablet by mouth 2 (two)  times daily. 03/05/15 03/12/15  Hope Orlene Och, NP  TRINESSA, 28, 0.18/0.215/0.25 MG-35 MCG tablet Take 1 tablet by mouth daily. 12/19/14   Historical Provider, MD   BP 121/84 mmHg  Pulse 93  Temp(Src) 99.1 F (37.3 C) (Oral)  Resp 20  Ht 5' 4.5" (1.638 m)  Wt 223 lb (101.152 kg)  BMI 37.70 kg/m2  SpO2 98%  LMP 02/09/2015 Physical Exam  Constitutional: She is oriented to person, place, and time. She appears well-developed and well-nourished. No distress.  HENT:  Head: Normocephalic and atraumatic.  Eyes: Conjunctivae are normal. No scleral icterus.  Neck: Normal range of motion.  Cardiovascular:  Normal rate, regular rhythm, normal heart sounds and intact distal pulses.   No murmur heard. Pulmonary/Chest: Effort normal and breath sounds normal.  Abdominal: Soft. She exhibits no distension. There is no tenderness.  Lymphadenopathy:    She has no cervical adenopathy.  Neurological: She is alert and oriented to person, place, and time.  Skin: Skin is warm and dry. She is not diaphoretic. There is erythema.  Improvement in erythema and induration to the right axilla. Packing removed with continued moderate amount of purulent drainage.   Psychiatric: She has a normal mood and affect.  Nursing note and vitals reviewed.   ED Course  Procedures (including critical care time) DIAGNOSTIC STUDIES: Oxygen Saturation is 98% on RA, nl by my interpretation.    COORDINATION OF CARE: 11:31 PM-Discussed treatment plan with pt at bedside and pt agreed to plan.   Labs Review Labs Reviewed - No data to display  Imaging Review No results found.   EKG Interpretation None      MDM   Final diagnoses:  Wound check, abscess   Carly Atkins presents for abscess wound check.  Patient has had improvement in her erythema and induration however upon packing removal a moderate amount of purulent drainage remains. Will replace packing for continued drainage and have her return to the emergency department in 2 days for repeat wound check and packing removal. Patient complaining of nausea and vomiting after taking her antibiotics on an empty stomach. Recommend eating first. Patient also given Phenergan to help prevent vomiting she has an allergy to Zofran.  Patient temperature 99.1 degrees here in the emergency department however 2 days ago she was febrile to 102. Patient reports she feels much better and is without fevers at home.  BP 121/84 mmHg  Pulse 93  Temp(Src) 99.1 F (37.3 C) (Oral)  Resp 20  Ht 5' 4.5" (1.638 m)  Wt 223 lb (101.152 kg)  BMI 37.70 kg/m2  SpO2 98%  LMP 02/09/2015  I  personally performed the services described in this documentation, which was scribed in my presence. The recorded information has been reviewed and is accurate.   Dahlia Client Naithan Delage, PA-C 03/10/15 2352  Blake Divine, MD 03/11/15 [phone]

## 2015-03-14 LAB — CULTURE, BLOOD (ROUTINE X 2)
CULTURE: NO GROWTH
Culture: NO GROWTH

## 2015-03-15 ENCOUNTER — Emergency Department (HOSPITAL_COMMUNITY)
Admission: EM | Admit: 2015-03-15 | Discharge: 2015-03-15 | Disposition: A | Discharge status: Home / Self Care | Payer: Medicaid Other | Attending: Emergency Medicine | Admitting: Emergency Medicine

## 2015-03-15 ENCOUNTER — Encounter (HOSPITAL_COMMUNITY): Payer: Self-pay | Admitting: Family Medicine

## 2015-03-15 DIAGNOSIS — Z793 Long term (current) use of hormonal contraceptives: Secondary | ICD-10-CM | POA: Insufficient documentation

## 2015-03-15 DIAGNOSIS — Z4801 Encounter for change or removal of surgical wound dressing: Secondary | ICD-10-CM | POA: Insufficient documentation

## 2015-03-15 DIAGNOSIS — Z872 Personal history of diseases of the skin and subcutaneous tissue: Secondary | ICD-10-CM | POA: Insufficient documentation

## 2015-03-15 DIAGNOSIS — Z5189 Encounter for other specified aftercare: Secondary | ICD-10-CM

## 2015-03-15 DIAGNOSIS — Z792 Long term (current) use of antibiotics: Secondary | ICD-10-CM | POA: Insufficient documentation

## 2015-03-15 NOTE — ED Notes (Signed)
Pt here for packing removal.

## 2015-03-15 NOTE — ED Provider Notes (Signed)
History  This chart was scribed for non-physician practitioner, NicolWynetta EmeryPA-C,working with Lyndal Pulley, MD, by Karle Plumber, ED Scribe. This patient was seen in room TR06C/TR06C and the patient's care was started at 6:05 PM.  Chief Complaint  Patient presents with  . Wound Check   The history is provided by the patient and medical records. No language interpreter was used.    HPI Comments:  Carly Atkins is a 19 y.o. female who presents to the Emergency Department needing a wound check from having an abscess of the right axilla incised and drained one week ago. She states she returned to have the packing removed four days ago and it was repacked. She returns today to have the packing removed. She states the wound has been draining but she states it is lessening. She denies modifying factors. She denies bleeding, fever, chills, nausea, vomiting, warmth, redness or pain.   Past Medical History  Diagnosis Date  . Recurrent boils    History reviewed. No pertinent past surgical history. History reviewed. No pertinent family history. History  Substance Use Topics  . Smoking status: Never Smoker   . Smokeless tobacco: Not on file  . Alcohol Use: Yes   OB History    No data available     Review of Systems A complete 10 system review of systems was obtained and all systems are negative except as noted in the HPI and PMH.   Allergies  Zofran  Home Medications   Prior to Admission medications   Medication Sig Start Date End Date Taking? Authorizing Provider  cephALEXin (KEFLEX) 500 MG capsule Take 1 capsule (500 mg total) by mouth 4 (four) times daily. 03/09/15   Shauntee Karp, PA-C  HYDROcodone-acetaminophen (NORCO) 5-325 MG per tablet Take 1 tablet by mouth every 6 (six) hours as needed. 03/05/15   Hope Orlene Och, NP  ibuprofen (ADVIL,MOTRIN) 200 MG tablet Take 200 mg by mouth every 6 (six) hours as needed for mild pain or moderate pain.    Historical Provider, MD   metroNIDAZOLE (FLAGYL) 500 MG tablet Take 1 tablet (500 mg total) by mouth 2 (two) times daily. One po bid x 7 days 01/02/15   Mercedes Camprubi-Soms, PA-C  naproxen (NAPROSYN) 500 MG tablet Take 1 tablet (500 mg total) by mouth 2 (two) times daily as needed for mild pain, moderate pain or headache (TAKE WITH MEALS.). 01/02/15   Mercedes Camprubi-Soms, PA-C  ondansetron (ZOFRAN) 4 MG tablet Take 1 tablet (4 mg total) by mouth every 8 (eight) hours as needed for nausea or vomiting. 03/09/15   Joni Reining Leilynn Pilat, PA-C  promethazine (PHENERGAN) 25 MG tablet Take 1 tablet (25 mg total) by mouth every 6 (six) hours as needed for nausea or vomiting. 03/10/15   Hannah Muthersbaugh, PA-C  TRINESSA, 28, 0.18/0.215/0.25 MG-35 MCG tablet Take 1 tablet by mouth daily. 12/19/14   Historical Provider, MD   Triage Vitals: BP 107/83 mmHg  Pulse 80  Temp(Src) 98.2 F (36.8 C) (Oral)  Resp 18  Wt 221 lb (100.245 kg)  SpO2 98%  LMP 02/09/2015 Physical Exam  Constitutional: She is oriented to person, place, and time. She appears well-developed and well-nourished.  HENT:  Head: Normocephalic and atraumatic.  Eyes: EOM are normal.  Neck: Normal range of motion.  Cardiovascular: Normal rate.   Pulmonary/Chest: Effort normal.  Musculoskeletal: Normal range of motion.  Neurological: She is alert and oriented to person, place, and time.  Skin: Skin is warm and dry.  Packing in place to  axilla on right side. No surrounding cellulitis. No significant pain or warmth.  Packing is removed and there is slight purulent discharge.  Psychiatric: She has a normal mood and affect. Her behavior is normal.  Nursing note and vitals reviewed.   ED Course  Procedures (including critical care time) DIAGNOSTIC STUDIES: Oxygen Saturation is 98% on RA, normal by my interpretation.   COORDINATION OF CARE: 6:09 PM- Will remove packing and dress wound. Pt verbalizes understanding and agrees to plan.  Medications - No data to  display  Labs Review Labs Reviewed - No data to display  Imaging Review No results found.   EKG Interpretation None      MDM   Final diagnoses:  Wound check, abscess   Filed Vitals:   03/15/15 1721 03/15/15 1826  BP: 107/83 138/91  Pulse: 80 73  Temp: 98.2 F (36.8 C)   TempSrc: Oral   Resp: 18 18  Weight: 221 lb (100.245 kg)   SpO2: 98% 95%     Carly Atkins is a pleasant 19 y.o. female presenting for packing removal. Packing removed and patient is advised to aggressively irrigate the wound morning and night. It had an extensive discussion of return precautions patient verbalizes her understanding.  Evaluation does not show pathology that would require ongoing emergent intervention or inpatient treatment. Pt is hemodynamically stable and mentating appropriately. Discussed findings and plan with patient/guardian, who agrees with care plan. All questions answered. Return precautions discussed and outpatient follow up given.    I personally performed the services described in this documentation, which was scribed in my presence. The recorded information has been reviewed and is accurate.    [name], PA-C 03/15/15 2307  Lyndal Pulley, MD 03/16/15 [phone]

## 2015-03-15 NOTE — Discharge Instructions (Signed)
Do not hesitate to return to the emergency room for any new, worsening or concerning symptoms. ° °Please obtain primary care using resource guide below. Let them know that you were seen in the emergency room and that they will need to obtain records for further outpatient management. ° ° ° °Emergency Department Resource Guide °1) Find a Doctor and Pay Out of Pocket °Although you won't have to find out who is covered by your insurance plan, it is a good idea to ask around and get recommendations. You will then need to call the office and see if the doctor you have chosen will accept you as a new patient and what types of options they offer for patients who are self-pay. Some doctors offer discounts or will set up payment plans for their patients who do not have insurance, but you will need to ask so you aren't surprised when you get to your appointment. ° °2) Contact Your Local Health Department °Not all health departments have doctors that can see patients for sick visits, but many do, so it is worth a call to see if yours does. If you don't know where your local health department is, you can check in your phone book. The CDC also has a tool to help you locate your state's health department, and many state websites also have listings of all of their local health departments. ° °3) Find a Walk-in Clinic °If your illness is not likely to be very severe or complicated, you may want to try a walk in clinic. These are popping up all over the country in pharmacies, drugstores, and shopping centers. They're usually staffed by nurse practitioners or physician assistants that have been trained to treat common illnesses and complaints. They're usually fairly quick and inexpensive. However, if you have serious medical issues or chronic medical problems, these are probably not your best option. ° °No Primary Care Doctor: °- Call [facility] at  832-8000 - they can help you locate a primary care doctor that  accepts your  insurance, provides certain services, etc. °- Physician Referral Service- 1-800-533-3463 ° °Chronic Pain Problems: °Organization         Address  Phone   Notes  °[facility]  (336) 297-2271 Patients need to be referred by their primary care doctor.  ° °Medication Assistance: °Organization         Address  Phone   Notes  °[place] [facility] [address] °[place], [place] [address] (336) 641-8030 --Must be a resident of [place] °-- Must have NO insurance coverage whatsoever (no Medicaid/ Medicare, etc.) °-- The pt. MUST have a primary care doctor that directs their care regularly and follows them in the community °  °[facility]  (866) 331-1348   °[employer]  (888) 892-1162   ° °Agencies that provide inexpensive medical care: °Organization         Address  Phone   Notes  °[facility]  (336) 832-8035   °[facility]    (336) 832-7272   °[facility] [address] °[place], [place] [address] (336) 832-4777   °[facility] [address], °[place] (336) 271-4999   °[facility]    (336) 373-0678   °[facility]    (336) 272-1050   °[facility] ° [address], [place] Phone:  (336) 832-4444, Fax:  (336) 832-4440 Hours of Operation:  9 am -   6 pm, M-F.  Also accepts Medicaid/Medicare and self-pay.  °[facility] ° [address], [place] Phone: (336) 832-3150, Fax: (336) [phone]. Hours of Operation:  8:30 am - 5:30 pm, M-F.  Also accepts Medicaid and self-pay.  °[facility] [place] [address], [place] Phone: (336) 878-6027   °[facility] [address], [place], [place] (336)723-1848, Ext. 123 Mondays & Thursdays: 7-9 AM.  First 15 patients are seen on a first come, first serve basis. °  ° °Medicaid-accepting [place] Providers: ° °Organization          Address  Phone   Notes  °[facility] [address], [place] (336) 641-2100 Also accepts self-pay patients.  °[facility] [address], [place] ° (336) 856-9996   °[facility] [address], [place] (336) 288-8857   °[facility] [address] [place] [address], [place] (336) 299-7000   °Veita Bland [address], [place]  ° (336) 373-1557 Only accepts [place] Access Medicaid patients after they have their name applied to their card.  ° °Self-Pay (no insurance) in [place]: ° °Organization         Address  Phone   Notes  °Sickle Cell Patients, [facility] [address], [place] (336) 832-1970   °[facility] [address], [place] (336) 832-4400   °[facility]  ° [address],  (336) 992-4800   °[facility]/Dr. Osei-Bonsu ° [address] [place] [address], [place] or [address], [place] (336) [phone] Phone number for both [place] and [place] locations is the same.  °[facility] [address], [place] (336) 299-0000   °[facility] [address] [place] [address], [place] or [address] (336) 852-7530 °(336) 878-2260   °[facility] [address], [place] (336) 350-1642, phone; (336) 294-5005, fax Sees patients 1st and 3rd Saturday of every month.  Must not qualify for public or private insurance (i.e. Medicaid, Medicare, [place] Health Choice, Veterans' Benefits) • Household income should be no more than 200% of the poverty level •The clinic cannot treat you if you are pregnant or think you are pregnant • Sexually transmitted diseases are not treated at the clinic.  ° ° °Dental Care: °Organization         Address  Phone  Notes  °[facility] [address],   (336) 641-6152 Accepts children up to age 21 who are enrolled in Medicaid or [place] Health Choice; pregnant women with a Medicaid card; and children who have applied for Medicaid or [place] Health Choice, but were declined, whose parents can pay a reduced fee at time of service.  °[facility] [place]  [address], [place] (336) 641-7733 Accepts children up to age 21 who are enrolled in Medicaid or [place] Health Choice; pregnant women with a Medicaid card; and children who have applied for Medicaid or [place] Health Choice, but were declined, whose parents can pay a reduced fee at time of service.  °[facility] ° [address],  (336) 641-4533 Patients are seen by appointment only. Walk-ins are not accepted. [facility] will see patients 18 years of age and   older. °Monday - Tuesday (8am-5pm) °Most Wednesdays (8:30-5pm) °$30 per visit, cash only  °[facility] ° [address], [place] (336) 641-4533 Patients are seen by appointment only. Walk-ins are not accepted. [facility] will see patients 18 years of age and older. °One Wednesday Evening (Monthly: Volunteer Based).  $30 per visit, cash only  °[facility]  (919) 537-3737 for adults; Children under age 4, call Graduate Pediatric Dentistry at (919) [phone]. Children aged 4-14, please call (919) [phone] to request a pediatric application. ° Dental services are provided in all areas of dental care including fillings, crowns and bridges, complete and partial dentures, implants, gum treatment, root canals, and extractions. Preventive care is also provided. Treatment is provided to both adults and children. °Patients are selected via a lottery and there is often a waiting list. °  °[facility] [address], °[place] ° (336) 763-8833 [email or web address] °  °[facility] [address], [place], [place]  (336)723-1848, Ext. 123 Second and Fourth Thursday of each month, opens at 6:30 AM; Clinic ends at 9 AM.  Patients are seen on a first-come first-served basis, and a limited number are seen during each clinic.  ° °[facility] ° [address], [place], [place] (336) 723-7904   Eligibility Requirements °You must have lived in [place], [place], or [place] counties for at least the last three months. °  You cannot be eligible for state or federal sponsored healthcare insurance, including Veterans Administration, Medicaid, or Medicare. °  You generally cannot be eligible for healthcare insurance through your employer.  °  How to apply: °Eligibility screenings are held every Tuesday and Wednesday afternoon from 1:00 pm until 4:00 pm. You do not need an appointment for the interview!  °[facility] [address], [place], [place] 336-631-2330   °[facility]  336-342-8273   °[facility]  336-703-3100   °[facility]  336-570-6415   ° °Behavioral Health Resources in the Community: °Intensive Outpatient Programs °Organization         Address  Phone  Notes  °[facility] [address], [place], [place] 336-878-6098   °[facility] [address], [place], [place] 336-832-9800   °[facility] [address], [place], [place] ° 336-882-2125   °[facility] [address],  °[place], [place] 1-800-853-5163 or 336-641-4981   °Substance Abuse Resources °Organization         Address  Phone  Notes  °[facility]  336-882-2125   °[facility]  336-784-9470   °[facility]  336-285-9073   °[facility]  336-845-3988   °Residential & Outpatient Substance Abuse Program  1-800-659-3381   °Psychological Services °Organization         Address  Phone  Notes  °[facility]  336- 832-9600   °[facility]  336- 378-7881    °[facility] [address], [place] 1-800-853-5163 or 336-641-4981   ° °Mobile Crisis Teams °Organization         Address  Phone  Notes  °[facility], Mobile Crisis Care Unit  1-877-626-1772   °Assertive °[facility] ° [address] [place], [place] 336-834-9664   °Sharon DeEsch [address] ° [place] 336-554-5454   ° °Self-Help/Support Groups °Organization         Address    Phone             Notes  °[facility] [place] - variety of support groups  336- 373-1402 Call for more information  °Narcotics Anonymous (NA), [facility] [address], °[place]  2 meetings at this location  ° °Residential Treatment Programs °Organization         Address  Phone  Notes  °[facility] [address],    °[place]  1-866-801-8205   °[facility] ° [address], [place], [place] 704-293-8524   °[facility] [address], [place] 336-845-3988 Admissions: 8am-3pm M-F  °[facility] [address]    °[place], [place] 336-841-1104   °[facility] [address], [place], [place] 336-379-7146   °[facility] [address]  °[place], [place] 336-285-9073   °[facility] Intensive Outpatient [address], [place], [place] 336-852-3033   °[facility] [address]  °[place], [place] 1-877-615-2722 or 336-784-9470   °[facility] [address] [place], [place] 336-227-7417 Accepts Medicaid  °[facility] [address]  °[place] 1-800-659-3381 Substance Abuse/Addiction Treatment  ° °[place] Behavioral Health Resources °Organization         Address  Phone  Notes  °[facility]  (888) 581-9988   °Julie Brannon, PhD [address] [place], [place]   (336) 349-5553 or (336) 951-0000   °[facility]   [address] °[place], [place] (336) 349-4454   °[facility] [address], [place], [place] (336) 342-8316 Insurance/Medicaid/sponsorship through [facility]  °[facility] [address]                                    [place], [place] (336) 342-8316 Therapy/tele-psych/case  °[facility] [address]  ° [place], [place] (336) 349-2233    °Dr. Arfeen  (336) 349-4544   °[facility] [place]  [employer] [place] [facility] 1) [address],  °2) [address], [place] °3)  [address], [place] (336) 349-3220 °(336) 342-7768 ° °(336) 342-8140   °[place] Child Abuse [facility] (336) 342-1394 or (336) 342-3537 (After Hours)    ° ° ° °

## 2016-03-16 ENCOUNTER — Ambulatory Visit: Payer: Self-pay

## 2016-03-18 ENCOUNTER — Ambulatory Visit (INDEPENDENT_AMBULATORY_CARE_PROVIDER_SITE_OTHER): Payer: BLUE CROSS/BLUE SHIELD | Admitting: Physician Assistant

## 2016-03-18 VITALS — BP 132/84 | HR 91 | Temp 98.6°F | Resp 17 | Ht 64.5 in | Wt 215.0 lb

## 2016-03-18 DIAGNOSIS — R21 Rash and other nonspecific skin eruption: Secondary | ICD-10-CM

## 2016-03-18 DIAGNOSIS — Z202 Contact with and (suspected) exposure to infections with a predominantly sexual mode of transmission: Secondary | ICD-10-CM

## 2016-03-18 LAB — POCT URINE PREGNANCY: Preg Test, Ur: NEGATIVE

## 2016-03-18 LAB — POCT SKIN KOH: SKIN KOH, POC: NEGATIVE

## 2016-03-18 MED ORDER — FLUCONAZOLE 150 MG PO TABS: 300.0000 mg | ORAL_TABLET | ORAL | 0 refills | Status: AC

## 2016-03-18 MED ORDER — CEFTRIAXONE SODIUM 250 MG IJ SOLR
250.0000 mg | Freq: Once | INTRAMUSCULAR | Status: AC
  Administered 2016-03-18: 250 mg via INTRAMUSCULAR

## 2016-03-18 MED ORDER — TRIAMCINOLONE ACETONIDE 0.5 % EX OINT: 1.0000 "application " | TOPICAL_OINTMENT | Freq: Two times a day (BID) | CUTANEOUS | 0 refills | Status: DC

## 2016-03-18 NOTE — Patient Instructions (Addendum)
     IF you received an x-ray today, you will receive an invoice from [facility]. Please contact [facility] at [phone] with questions or concerns regarding your invoice.   IF you received labwork today, you will receive an invoice from [facility]. Please contact [facility] at [phone] with questions or concerns regarding your invoice.   Our billing staff will not be able to assist you with questions regarding bills from these companies.  You will be contacted with the lab results as soon as they are available. The fastest way to get your results is to activate your My Chart account. Instructions are located on the last page of this paperwork. If you have not heard from us regarding the results in 2 weeks, please contact this office.    Please try the diflucan by itself without the use of the prescribed ointment. I would like you to try to use the selenium sulfide (selsum blue) over the counter when you are bathing.  These have anti-fungal properties.  Let's give this about 2 weeks, if symptoms worsen--return for punch biopsy. Make sure that you are not using any new soaps or hygeine products that may be sparking an allergic reaction.

## 2016-03-18 NOTE — Progress Notes (Signed)
Patient ID: Carly Atkins, female   DOB: 02/18/1996, 20 y.o.   MRN: [id number] [facility] [address], [place] [id number] [phone]  By signing my name below I, Shelah LewandowskyJoseph Thomas, attest that this documentation has been prepared under the direction and in the presence of Trena PlattStephanie English PA. Electonically Signed. Shelah LewandowskyJoseph Thomas, Scribe 03/18/2016 at 6:28 PM   Date:  03/18/2016   Name:  Carly Atkins   DOB:  12/15/1995   MRN:  [id number]  PCP:  No primary care provider on file.    History of Present Illness:  Carly Atkins is a 20 y.o. female patient who [facility]D [facility]. 6 days ago, pt was contacted by sexual partner who tested positive for gonorrhea. Pt had unprotected sex with the gonorrhea positive partner 2 weeks ago. Pt takes her hormonal birth control faithfully. Pt's LNMP was 2 weeks ago. Pt denies any vaginal discharge, vaginal pain, or sore throat.  Pt also c/o painful bumps rt lower extremity. Pt states that she has had similar bumps in the past. Pt also reports bilat armpit irritation as well pruritic circular patches of skin on her anterior trunk and upper extremites. Pt states that the itchy patches are starting to spread to her sides and back. Pt states that she has tried using tea tree oil, cocoa butter, coconut oil, and anti-itch cream on skin with no relief.    There are no active problems to display for this patient.   Past Medical History:  Diagnosis Date   Recurrent boils     No past surgical history on file.  Social History  Substance Use Topics   Smoking status: Never Smoker   Smokeless tobacco: Not on file   Alcohol use Yes    No family history on file.  Allergies  Allergen Reactions   Zofran [Ondansetron Hcl] Rash    Medication list has been reviewed and updated.  Current Outpatient Prescriptions on File Prior to Visit  Medication Sig Dispense Refill   TRINESSA, 28, 0.18/0.215/0.25 MG-35 MCG  tablet Take 1 tablet by mouth daily.  1   cephALEXin (KEFLEX) 500 MG capsule Take 1 capsule (500 mg total) by mouth 4 (four) times daily. (Patient not taking: Reported on 03/18/2016) 20 capsule 0   HYDROcodone-acetaminophen (NORCO) 5-325 MG per tablet Take 1 tablet by mouth every 6 (six) hours as needed. (Patient not taking: Reported on 03/18/2016) 10 tablet 0   ibuprofen (ADVIL,MOTRIN) 200 MG tablet Take 200 mg by mouth every 6 (six) hours as needed for mild pain or moderate pain.     metroNIDAZOLE (FLAGYL) 500 MG tablet Take 1 tablet (500 mg total) by mouth 2 (two) times daily. One po bid x 7 days (Patient not taking: Reported on 03/18/2016) 14 tablet 0   naproxen (NAPROSYN) 500 MG tablet Take 1 tablet (500 mg total) by mouth 2 (two) times daily as needed for mild pain, moderate pain or headache (TAKE WITH MEALS.). (Patient not taking: Reported on 03/18/2016) 20 tablet 0   promethazine (PHENERGAN) 25 MG tablet Take 1 tablet (25 mg total) by mouth every 6 (six) hours as needed for nausea or vomiting. (Patient not taking: Reported on 03/18/2016) 12 tablet 0   No current facility-administered medications on file prior to visit.     ROS ROS unremarkable unless otherwise specified.  Physical Examination: BP 132/84 (BP Location: Right Arm, Patient Position: Sitting, Cuff Size: Normal)    Pulse 91    Temp 98.6  F (37 C) (Oral)    Resp 17    Ht 5' 4.5" (1.638 m)    Wt 215 lb (97.5 kg)    SpO2 100%    BMI 36.33 kg/m  Ideal Body Weight: ([id number])@  Physical Exam  Constitutional: She is oriented to person, place, and time. She appears well-developed and well-nourished. No distress.  HENT:  Head: Normocephalic and atraumatic.  Right Ear: External ear normal.  Left Ear: External ear normal.  Eyes: Conjunctivae and EOM are normal. Pupils are equal, round, and reactive to light.  Cardiovascular: Normal rate.   Pulmonary/Chest: Effort normal. No respiratory distress.  Neurological: She is alert  and oriented to person, place, and time.  Skin: She is not diaphoretic.  Pt has erythematous, tender nodules with central fluctuance on her rt lowe extremity. Pt also has anular, scaly, raised, dry skin patches measuring 1cm in diameter on her anterior trunk, traveling to her posterior trunk bilat, and on her upper extremities bilat.  Psychiatric: She has a normal mood and affect. Her behavior is normal.   Results for orders placed or performed in visit on 03/18/16  POCT Skin KOH  Result Value Ref Range   Skin KOH, POC Negative      Assessment and Plan: Rashanna Heier is a 20 y.o. female who is here today for rash on trunk, and lower extremity, and std exposure. Given rocephin shot at this time.  Will treat trunk with diflucan weekly for up to 4 weeks.  She will return in 2 weeks if no improvement.    Rash and nonspecific skin eruption - Plan: POCT Skin KOH, RPR, HIV antibody, POCT urine pregnancy, WOUND CULTURE, fluconazole (DIFLUCAN) 150 MG tablet  STD exposure - Plan: RPR, HIV antibody, POCT urine pregnancy, GC/Chlamydia Probe Amp, cefTRIAXone (ROCEPHIN) injection 250 mg  Exposure to gonorrhea - Plan: GC/Chlamydia Probe Amp, cefTRIAXone (ROCEPHIN) injection 250 mg   Trena Platt, PA-C [facility] 03/18/2016 5:24 PM   Addendum: given azithromycin for treatment of chlamydia.  Instructed of medication administration.  augmentin also added to treat staph aureus infection which is likely at the lower extremity only.  She states that the diflucan is already working along her trunk.

## 2016-03-19 LAB — HIV ANTIBODY (ROUTINE TESTING W REFLEX): HIV 1&2 Ab, 4th Generation: NONREACTIVE

## 2016-03-20 LAB — GC/CHLAMYDIA PROBE AMP
CT PROBE, AMP APTIMA: DETECTED — AB
GC Probe RNA: DETECTED — AB

## 2016-03-20 LAB — RPR

## 2016-03-21 LAB — WOUND CULTURE: Gram Stain: NONE SEEN

## 2016-03-25 MED ORDER — AMOXICILLIN-POT CLAVULANATE 875-125 MG PO TABS: 1.0000 | ORAL_TABLET | Freq: Two times a day (BID) | ORAL | 0 refills | Status: AC

## 2016-03-25 MED ORDER — AZITHROMYCIN 250 MG PO TABS: 1000.0000 mg | ORAL_TABLET | Freq: Once | ORAL | 0 refills | Status: AC

## 2016-03-26 ENCOUNTER — Encounter: Payer: Self-pay | Admitting: Physician Assistant

## 2016-03-30 ENCOUNTER — Other Ambulatory Visit: Payer: Self-pay | Admitting: Physician Assistant

## 2016-09-03 NOTE — Progress Notes (Signed)
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 female seen in consultation for evaluation of positive ANA. According to patient last summer she developed a rash in her breast which resolved with topical treatment. A month later she developed a rash all over her scalp. And gradually the rash spread over her extremities. 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 She was also told that the guttate psoriasis can be associated with upper respiratory tract infection. She went to see ENT physician for that reason. The ENT physician felt that she did not have a strep throat and gave her a steroid taper. The steroids helped her rash but soon after this choice was finished the rash came back. She denies any joint pain. She does not have much pruritus now. Her recent labs showed positive ANA for that reason she was referred to me. She denies any family history of psoriasis autoimmune disease.  Activities of Daily Living:  Patient reports morning stiffness for0 minutes.   Patient Denies nocturnal pain.  Difficulty dressing/grooming: Denies Difficulty climbing stairs: Denies Difficulty getting out of chair: Denies Difficulty using hands for taps, buttons, cutlery, and/or writing: Denies   Review of Systems  Constitutional: Negative for fatigue, night sweats, weight gain, weight loss and weakness.  HENT: Negative for mouth sores, trouble swallowing, trouble swallowing, mouth dryness and nose dryness.   Eyes: Negative for pain, redness, visual disturbance and dryness.  Respiratory: Negative for cough, shortness of  breath and difficulty breathing.   Cardiovascular: Negative for chest pain, palpitations, hypertension, irregular heartbeat and swelling in legs/feet.  Gastrointestinal: Negative for blood in stool, constipation and diarrhea.  Endocrine: Negative for increased urination.  Genitourinary: Negative for vaginal dryness.  Musculoskeletal: Negative for arthralgias, joint pain, joint swelling, myalgias, muscle weakness, morning stiffness, muscle tenderness and myalgias.  Skin: Positive for rash. Negative for color change, hair loss, skin tightness, ulcers and sensitivity to sunlight.  Allergic/Immunologic: Negative for susceptible to infections.  Neurological: Negative for dizziness, memory loss and night sweats.  Hematological: Negative for swollen glands.  Psychiatric/Behavioral: Negative for depressed mood and sleep disturbance. The patient is not nervous/anxious.     PMFS History:  Patient Active Problem List   Diagnosis Date Noted  . Positive ANA (antinuclear antibody) 09/04/2016  . Psoriasis 09/04/2016    Past Medical History:  Diagnosis Date  . Recurrent boils     No family history on file. No past surgical history on file. Social History   Social History Narrative  . No narrative on file     Objective: Vital Signs: BP 130/62   Pulse 78   Resp 16   Ht 5' 4.5" (1.638 m)   Wt 222 lb (100.7 kg)   LMP 06/12/2016 (Approximate)   BMI 37.52 kg/m    Physical Exam  Constitutional: She is oriented to person, place, and time. She appears well-developed and well-nourished.  HENT:  Head: Normocephalic and atraumatic.  Eyes: Conjunctivae and EOM are normal.  Neck: Normal range of motion.  Cardiovascular: Normal rate, regular rhythm, normal heart sounds and intact distal pulses.   Pulmonary/Chest: Effort normal and breath sounds normal.  Abdominal: Soft. Bowel sounds are normal.  Lymphadenopathy:    She has no cervical adenopathy.  Neurological: She is alert and oriented to  person, place, and time.  Skin: Skin is warm and dry. Capillary refill takes less than 2 seconds.  Dictated psoriasis lesions noted all over the extremities and trunk. She appears to have some secondary infection in her scalp.  Psychiatric: She has a normal mood and affect. Her behavior is normal.  Nursing note and vitals reviewed.    Musculoskeletal Exam: C-spine and thoracic lumbar spine good range of motion no SI joint tenderness. She has good range of motion of her shoulders elbows wrists joint MCPs PIPs DIPs hip joints knee joints ankles MTPs PIPs DIPs with no synovitis.  CDAI Exam: No CDAI exam completed.    Investigation: Findings:  05/28/2016 ANA 1:160 speckled positive, DS negative, Smith negative, RNP negative, SSA negative, SSB negative, SCL 70 negative, Jo 1 negative, HIV negative CBC hemoglobin A 13.1, RPR nonreactive    Imaging: No results found.  Speciality Comments: No specialty comments available.    Procedures:  No procedures performed Allergies: Zofran [ondansetron hcl]   Assessment / Plan:     Visit Diagnoses: Positive ANA (antinuclear antibody) patient has low titer positive ANA but no clinical features of autoimmune disease on examination. She has no history of arthralgias or joint swelling.  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 I had detailed discussion with patient regarding that. She is quite frustrated and not receiving any treatment for the last several months now. Detailed counseling was provided in different treatment options were discussed. I did discuss with her that she will need a strict contraception to start on these medications in future. She will contact her GYN for that. I also made a phone call to Dr. Jorja Loa and explained the situation. They have seen patient in their office before. And he would try to work her in in the next week. Given her a handout on methotrexate to  review in the meantime. Indications side effects contraindications were also discussed at length. She also may benefit from light therapy which she can discuss with dermatologist. I would avoid use of steroids.  She has secondary infection in her scalp which may be complicating this picture as well. I discussed the treatment for that as well. She was given Keflex 500 mg 3 times a day for 10 days. Side effects were discussed.  I paid patient aware if she develops any joint symptoms in future she can contact us. At this point she will return for follow-up only on when necessary basis.   Orders: No orders of the defined types were placed in this encounter.  Meds ordered this encounter  Medications  . cephALEXin (KEFLEX) 500 MG capsule    Sig: Take 1 capsule (500 mg total) by mouth 3 (three) times daily.    Dispense:  30 capsule    Refill:  0    Face-to-face time spent with patient was 45 minutes. 50% of time was spent in counseling and coordination of care.  Follow-Up Instructions: Return if symptoms worsen or fail to improve, for Psoriasis.   Pollyann Savoy, MD  Note - This record has been created using Animal nutritionist.  Chart creation errors have been sought, but may  not always  have been located. Such creation errors do not reflect on  the standard of medical care.

## 2016-09-04 ENCOUNTER — Encounter: Payer: Self-pay | Admitting: Rheumatology

## 2016-09-04 ENCOUNTER — Ambulatory Visit (INDEPENDENT_AMBULATORY_CARE_PROVIDER_SITE_OTHER): Payer: BLUE CROSS/BLUE SHIELD | Admitting: Rheumatology

## 2016-09-04 VITALS — BP 130/62 | HR 78 | Resp 16 | Ht 64.5 in | Wt 222.0 lb

## 2016-09-04 DIAGNOSIS — R768 Other specified abnormal immunological findings in serum: Secondary | ICD-10-CM

## 2016-09-04 DIAGNOSIS — L409 Psoriasis, unspecified: Secondary | ICD-10-CM | POA: Diagnosis not present

## 2016-09-04 MED ORDER — CEPHALEXIN 500 MG PO CAPS: 500.0000 mg | ORAL_CAPSULE | Freq: Three times a day (TID) | ORAL | 0 refills | Status: DC

## 2016-09-04 NOTE — Progress Notes (Signed)
Pharmacy Note Subjective: Patient presents today to the [facility] to see Dr. Corliss Skainseveshwar.  Patient seen by the pharmacist for counseling on methotrexate.    Assessment/Plan:  Patient was counseled on the purpose, proper use, and adverse effects of methotrexate including nausea, infection, and signs and symptoms of pneumonitis.  Reviewed with patient that methotrexate is a once weekly medication that should be taken along with folic acid daily.  Discussed that strict contraception must be used with methotrexate.  Discussed th need for screening labs and chest x-ray prior to initiation of therapy.  Discussed the importance of frequent monitoring of kidney and liver function and blood counts while on the medication.  Counseled patient to avoid sulfa antibiotics such as Bactrim or Septra while on methotrexate.  Provided patient with educational materials on methotrexate.  Plan is for patient to see dermatologist at this time.  Patient will not be started on the medication today.    Carly Atkins, Pharm.D., BCPS Clinical Pharmacist Pager: [phone] Phone: [phone] 09/04/2016 9:09 AM

## 2016-10-09 ENCOUNTER — Ambulatory Visit: Payer: Medicaid Other | Admitting: Rheumatology

## 2017-12-24 ENCOUNTER — Other Ambulatory Visit: Payer: Self-pay

## 2017-12-24 ENCOUNTER — Encounter (HOSPITAL_COMMUNITY): Payer: Self-pay | Admitting: Emergency Medicine

## 2017-12-24 ENCOUNTER — Emergency Department (HOSPITAL_COMMUNITY)
Admission: EM | Admit: 2017-12-24 | Discharge: 2017-12-24 | Disposition: A | Discharge status: Home / Self Care | Payer: Self-pay | Attending: Emergency Medicine | Admitting: Emergency Medicine

## 2017-12-24 DIAGNOSIS — Z79899 Other long term (current) drug therapy: Secondary | ICD-10-CM | POA: Insufficient documentation

## 2017-12-24 DIAGNOSIS — L0291 Cutaneous abscess, unspecified: Secondary | ICD-10-CM

## 2017-12-24 DIAGNOSIS — L42 Pityriasis rosea: Secondary | ICD-10-CM | POA: Insufficient documentation

## 2017-12-24 DIAGNOSIS — L02411 Cutaneous abscess of right axilla: Secondary | ICD-10-CM | POA: Insufficient documentation

## 2017-12-24 MED ORDER — DOXYCYCLINE HYCLATE 100 MG PO CAPS: 100.0000 mg | ORAL_CAPSULE | Freq: Two times a day (BID) | ORAL | 0 refills | Status: DC

## 2017-12-24 MED ORDER — HYDROCORTISONE 1 % EX CREA: TOPICAL_CREAM | CUTANEOUS | 0 refills | Status: DC

## 2017-12-24 NOTE — ED Provider Notes (Signed)
[facility] EMERGENCY DEPARTMENT Provider Note   CSN: [id number] Arrival date & time: 12/24/17  1402     History   Chief Complaint Chief Complaint  Patient presents with  . Rash    HPI Carly Atkins is a 22 y.o. female.  HPI   Patient is a 22 year old female presents the ED today to be evaluated for a rash that has been present on her body for the last 5 months.  She states the rash is not itchy or painful.  States she had a similar rash last year and it went away on its own.  States the rash started out as one large patch beneath her breast.  This area since resolved and the rash spread throughout her entire body.  Reports that there are countless areas of darkened skin to her arms legs abdomen and back.  She reports she has a history of psoriasis diagnosed by dermatologist.  She no longer follows with his dermatologist as she is not located in the area anymore.  She has tried using lotion to help improve her symptoms no relief.  She denies any other associated symptoms including no fevers.  She does state that she has had recurrent boils to the bilateral axilla and currently has a boil that has been present for the right axilla for the last several weeks.  States it has been draining purulent fluid recently.  Past Medical History:  Diagnosis Date  . Recurrent boils     Patient Active Problem List   Diagnosis Date Noted  . Positive ANA (antinuclear antibody) 09/04/2016  . Psoriasis 09/04/2016    History reviewed. No pertinent surgical history.   OB History   None      Home Medications    Prior to Admission medications   Medication Sig Start Date End Date Taking? Authorizing Provider  cephALEXin (KEFLEX) 500 MG capsule Take 1 capsule (500 mg total) by mouth 3 (three) times daily. 09/04/16   Pollyann Savoy, MD  doxycycline (VIBRAMYCIN) 100 MG capsule Take 1 capsule (100 mg total) by mouth 2 (two) times daily. 12/24/17   Karry Causer S, PA-C    hydrocortisone cream 1 % Apply to affected area 2 times daily 12/24/17   Elyon Zoll S, PA-C  ibuprofen (ADVIL,MOTRIN) 200 MG tablet Take 200 mg by mouth every 6 (six) hours as needed for mild pain or moderate pain.    [provider]  triamcinolone ointment (KENALOG) 0.5 % Apply 1 application topically 2 (two) times daily. Patient not taking: Reported on 09/04/2016 03/18/16   Garnetta Buddy, PA  TRINESSA, 28, 0.18/0.215/0.25 MG-35 MCG tablet Take 1 tablet by mouth daily. 12/19/14   [provider]    Family History No family history on file.  Social History Social History   Tobacco Use  . Smoking status: Never Smoker  . Smokeless tobacco: Never Used  Substance Use Topics  . Alcohol use: Yes  . Drug use: No     Allergies   Zofran [ondansetron hcl]   Review of Systems Review of Systems  Constitutional: Negative for fever.  HENT: Negative for congestion and rhinorrhea.   Eyes: Negative for visual disturbance.  Respiratory: Negative for shortness of breath and wheezing.   Cardiovascular: Negative for chest pain.  Gastrointestinal: Negative for abdominal pain, constipation, diarrhea, nausea and vomiting.  Genitourinary: Negative for dysuria and urgency.  Musculoskeletal: Negative for myalgias.  Skin: Positive for rash.  Neurological: Negative for dizziness, weakness, light-headedness and headaches.  Physical Exam Updated Vital Signs BP 129/80 (BP Location: Right Arm)   Pulse 61   Temp 98.2 F (36.8 C) (Oral)   Resp 16   Ht  (1.626 m)   Wt 103.4 kg (228 lb)   SpO2 100%   BMI 39.14 kg/m   Physical Exam  Constitutional: She appears well-developed and well-nourished. No distress.  HENT:  Head: Normocephalic and atraumatic.  Eyes: Conjunctivae are normal.  Neck: Neck supple.  Cardiovascular: Normal rate and regular rhythm.  No murmur heard. Pulmonary/Chest: Effort normal and breath sounds normal. No respiratory distress.   Abdominal: Soft. There is no tenderness.  Musculoskeletal: She exhibits no edema.  Neurological: She is alert.  Skin: Skin is warm and dry. Capillary refill takes less than 2 seconds.  Countless oval plaques to the arms legs abdomen and trunk that appear to be hyperpigmented.  The areas are not tender or pruritic to touch.  Patient has area of fluctuance to the right axilla that is actively draining purulent fluid.  There is mild tenderness to palpation throughout.  Psychiatric: She has a normal mood and affect.  Nursing note and vitals reviewed.    ED Treatments / Results  Labs (all labs ordered are listed, but only abnormal results are displayed) Labs Reviewed - No data to display  EKG None  Radiology No results found.  Procedures Procedures (including critical care time)  Medications Ordered in ED Medications - No data to display   Initial Impression / Assessment and Plan / ED Course  I have reviewed the triage vital signs and the nursing notes.  Pertinent labs & imaging results that were available during my care of the patient were reviewed by me and considered in my medical decision making (see chart for details).    Final Clinical Impressions(s) / ED Diagnoses   Final diagnoses:  Pityriasis rosea  Abscess   Rash consistent with pityriasis rosea. Given the patient reports a h/o psoriasis and feels that her symptoms are consistent with when she had skin biopsy consistent with psoriasis, will give pt hydrocortisone cream to trial if there is improvement. Discussed that if there is no improvement from this medication that her sxs are most likely consistent with pityriasis rosea and that this rash will resolve on its own.  Patient also with abscess to right axilla that is actively draining purulent fluid.  Small area of surrounding cellulitis will treat with Doxy for 7 days.  Advised warm compresses. Patient denies any difficulty breathing or swallowing.  Pt has a patent  airway without stridor and is handling secretions without difficulty; no angioedema. No blisters, no pustules, no warmth, no draining sinus tracts, no bullous impetigo, no vesicles, no desquamation, no target lesions with dusky purpura or a central bulla. Not tender to touch. No concern for superimposed infection. No concern for SJS, TEN, TSS, tick borne illness, syphilis or other life-threatening condition.  Discharge patient home with hydrocortisone cream.  Advised patient to follow-up with PCP in 1 week for reevaluation.  Advised her on strict return precautions for any new or worsening symptoms in the meantime.  Patient voiced understanding of the plan and reasons to return immediately to ED.  All questions answered.   ED Discharge Orders        Ordered    doxycycline (VIBRAMYCIN) 100 MG capsule  2 times daily     12/24/17 1534    hydrocortisone cream 1 %     12/24/17 1534  Rayne Du 12/24/17 1557    Benjiman Core, MD 12/24/17 [phone]

## 2017-12-24 NOTE — ED Notes (Signed)
Provider just walked out of room, tech got vitals when this RN walked in a minute later patient had already left. Will keep d/c paperwork at desk if patient comes back

## 2017-12-24 NOTE — ED Triage Notes (Signed)
States noticed skin issues "spots" flaky last year resolved and returned. States skin problem returned and concerned why it returned with intermittent itching.

## 2017-12-24 NOTE — Discharge Instructions (Addendum)
You were given a prescription for antibiotics. Please take the antibiotic prescription fully.   You are also given a prescription for hydrocortisone cream.  You may use or the cortisone cream on the affected areas 2 times daily.  If this cream does not improve your symptoms and your symptoms are likely due to pityriasis rosea rather than psoriasis.  Please follow up with your primary care provider within 5-7 days for re-evaluation of your symptoms. If you do not have a primary care provider, information for a healthcare clinic has been provided for you to make arrangements for follow up care. Please return to the emergency room immediately if you experience any new or worsening symptoms or any symptoms that indicate worsening infection such as fevers, increased redness/swelling/pain, warmth, or drainage from the affected area.

## 2018-03-05 ENCOUNTER — Emergency Department
Admission: EM | Admit: 2018-03-05 | Discharge: 2018-03-05 | Disposition: A | Discharge status: Home / Self Care | Payer: Self-pay | Attending: Emergency Medicine | Admitting: Emergency Medicine

## 2018-03-05 DIAGNOSIS — L03114 Cellulitis of left upper limb: Secondary | ICD-10-CM | POA: Insufficient documentation

## 2018-03-05 DIAGNOSIS — Z79899 Other long term (current) drug therapy: Secondary | ICD-10-CM | POA: Insufficient documentation

## 2018-03-05 DIAGNOSIS — L03113 Cellulitis of right upper limb: Secondary | ICD-10-CM | POA: Insufficient documentation

## 2018-03-05 DIAGNOSIS — F17200 Nicotine dependence, unspecified, uncomplicated: Secondary | ICD-10-CM | POA: Insufficient documentation

## 2018-03-05 DIAGNOSIS — L732 Hidradenitis suppurativa: Secondary | ICD-10-CM | POA: Insufficient documentation

## 2018-03-05 HISTORY — DX: Psoriasis, unspecified: L40.9

## 2018-03-05 MED ORDER — CLINDAMYCIN PHOSPHATE 300 MG/2ML IJ SOLN
300.0000 mg | Freq: Once | INTRAMUSCULAR | Status: AC
  Administered 2018-03-05: 300 mg via INTRAMUSCULAR

## 2018-03-05 MED ORDER — CLINDAMYCIN PHOSPHATE 300 MG/2ML IJ SOLN
600.0000 mg | Freq: Once | INTRAMUSCULAR | Status: DC
  Filled 2018-03-05: qty 4

## 2018-03-05 MED ORDER — CLINDAMYCIN HCL 300 MG PO CAPS: 300.0000 mg | ORAL_CAPSULE | Freq: Four times a day (QID) | ORAL | 0 refills | Status: DC

## 2018-03-05 MED ORDER — CLINDAMYCIN PHOSPHATE 300 MG/50ML IV SOLN
300.0000 mg | Freq: Once | INTRAVENOUS | Status: DC
  Filled 2018-03-05: qty 50

## 2018-03-05 MED ORDER — CLINDAMYCIN PHOSPHATE 600 MG/4ML IJ SOLN: 600.0000 mg | Freq: Once | INTRAMUSCULAR | Status: DC

## 2018-03-05 NOTE — ED Provider Notes (Signed)
[facility] Emergency Department Provider Note  ____________________________________________  Time seen: Approximately 7:36 PM  I have reviewed the triage vital signs and the nursing notes.   HISTORY  Chief Complaint Abscess    HPI Carly Atkins is a 22 y.o. female who presents the emergency department for 3 skin complaints.  Patient reports that she has had a history of recurrent "boils" to bilateral axilla.  Patient reports that she developed one on her right axilla that open and has been draining but has not healed up quite normal.  Patient also has noticed one area of erythema to the pinky of her right hand and left forearm.  Patient has a history of psoriasis but states that both of the skin findings are not consistent with her psoriasis.  Patient denies any fevers or chills or systemic complaints to include nausea or vomiting, abdominal pain, diarrhea.  No recent antibiotic use.  No other complaints at this time.    Past Medical History:  Diagnosis Date  . Psoriasis   . Recurrent boils     Patient Active Problem List   Diagnosis Date Noted  . Positive ANA (antinuclear antibody) 09/04/2016  . Psoriasis 09/04/2016    History reviewed. No pertinent surgical history.  Prior to Admission medications   Medication Sig Start Date End Date Taking? Authorizing Provider  cephALEXin (KEFLEX) 500 MG capsule Take 1 capsule (500 mg total) by mouth 3 (three) times daily. 09/04/16   Pollyann Savoy, MD  clindamycin (CLEOCIN) 300 MG capsule Take 1 capsule (300 mg total) by mouth 4 (four) times daily. 03/05/18   Gershon Shorten, Delorise Royals, PA-C  doxycycline (VIBRAMYCIN) 100 MG capsule Take 1 capsule (100 mg total) by mouth 2 (two) times daily. 12/24/17   Couture, Cortni S, PA-C  hydrocortisone cream 1 % Apply to affected area 2 times daily 12/24/17   Couture, Cortni S, PA-C  ibuprofen (ADVIL,MOTRIN) 200 MG tablet Take 200 mg by mouth every 6 (six) hours as needed for mild  pain or moderate pain.    [provider]  triamcinolone ointment (KENALOG) 0.5 % Apply 1 application topically 2 (two) times daily. Patient not taking: Reported on 09/04/2016 03/18/16   Garnetta Buddy, PA  TRINESSA, 28, 0.18/0.215/0.25 MG-35 MCG tablet Take 1 tablet by mouth daily. 12/19/14   [provider]    Allergies Zofran Frazier Richards hcl]  No family history on file.  Social History Social History   Tobacco Use  . Smoking status: Current Every Day Smoker  . Smokeless tobacco: Never Used  Substance Use Topics  . Alcohol use: Yes  . Drug use: No     Review of Systems  Constitutional: No fever/chills Eyes: No visual changes. No discharge ENT: No upper respiratory complaints. Cardiovascular: no chest pain. Respiratory: no cough. No SOB. Gastrointestinal: No abdominal pain.  No nausea, no vomiting.  No diarrhea.  No constipation. Musculoskeletal: Negative for musculoskeletal pain. Skin: Positive for "boil" to the right axilla, 2 areas of erythematous skin, one to the right pinky and one to the left forearm. Neurological: Negative for headaches, focal weakness or numbness. 10-point ROS otherwise negative.  ____________________________________________   PHYSICAL EXAM:  VITAL SIGNS: ED Triage Vitals [03/05/18 1920]  Enc Vitals Group     BP 126/63     Pulse Rate 99     Resp 18     Temp 98.3 F (36.8 C)     Temp Source Oral     SpO2 98 %  Weight 230 lb (104.3 kg)     Height 5\' 4"  (1.626 m)     Head Circumference      Peak Flow      Pain Score 7     Pain Loc      Pain Edu?      Excl. in GC?      Constitutional: Alert and oriented. Well appearing and in no acute distress. Eyes: Conjunctivae are normal. PERRL. EOMI. Head: Atraumatic. Neck: No stridor.    Cardiovascular: Normal rate, regular rhythm. Normal S1 and S2.  Good peripheral circulation. Respiratory: Normal respiratory effort without tachypnea or retractions. Lungs CTAB. Good  air entry to the bases with no decreased or absent breath sounds. Musculoskeletal: Full range of motion to all extremities. No gross deformities appreciated. Neurologic:  Normal speech and language. No gross focal neurologic deficits are appreciated.  Skin:  Skin is warm, dry and intact. No rash noted.  Visualization of the right axilla reveals draining skin lesion consistent with ruptured abscess.  Patient has multiple scarred areas to bilateral axilla consistent with hidradenitis supurtiva.  Patient has erythematous skin lesion to the dorsal right pinky as well as erythematous skin lesion to the left forearm.  Lesion to the right pinky measures approximately 1 cm in diameter with no fluctuance or induration.  No indication of sausage digit or infectious tenderness of eyes.  Full range of motion to the digit with sensation and capillary refill distally.  Patient has erythematous skin lesion to the left forearm measuring approximately 6 cm in diameter.  No fluctuance or induration.  No drainage.  Radial pulse and sensation intact distally. Psychiatric: Mood and affect are normal. Speech and behavior are normal. Patient exhibits appropriate insight and judgement.   ____________________________________________   LABS (all labs ordered are listed, but only abnormal results are displayed)  Labs Reviewed - No data to display ____________________________________________  EKG   ____________________________________________  RADIOLOGY   No results found.  ____________________________________________    PROCEDURES  Procedure(s) performed:    Procedures    Medications  clindamycin (CLEOCIN) injection 600 mg (has no administration in time range)     ____________________________________________   INITIAL IMPRESSION / ASSESSMENT AND PLAN / ED COURSE  Pertinent labs & imaging results that were available during my care of the patient were reviewed by me and considered in my medical  decision making (see chart for details).  Review of the [place] CSRS was performed in accordance of the [facility] prior to dispensing any controlled drugs.      Patient's diagnosis is consistent with hidradenitis supportive and cellulitis of the left upper and right upper extremity.  Patient presents with multiple skin complaints.  On exam, patient has findings consistent with hidradenitis to the right axilla.  This is draining with no indication for incision and drainage.  Patient has 2 small areas of cellulitis to the left forearm and right pinky.  No indication of abscess requiring incision and drainage.  No indication at this time for labs or imaging.  Patient is given injection of clindamycin and will be discharged with prescription for oral clindamycin.  Wound care instructions are provided to patient regarding all areas.  She verbalizes understanding of same.  Patient will follow primary care as needed. Patient is given ED precautions to return to the ED for any worsening or new symptoms.     ____________________________________________  FINAL CLINICAL IMPRESSION(S) / ED DIAGNOSES  Final diagnoses:  Hidradenitis suppurativa  Cellulitis of left upper extremity  Cellulitis of right upper extremity      NEW MEDICATIONS STARTED DURING THIS VISIT:  ED Discharge Orders        Ordered    clindamycin (CLEOCIN) 300 MG capsule  4 times daily     03/05/18 1945          This chart was dictated using voice recognition software/Dragon. Despite best efforts to proofread, errors can occur which can change the meaning. Any change was purely unintentional.    Racheal PatchesCuthriell, Marinus Eicher D, PA-C 03/05/18 1946    Myrna BlazerSchaevitz, David Matthew, MD 03/05/18 [phone]

## 2018-03-05 NOTE — ED Triage Notes (Signed)
Patient c/o draining abscess to right axilla X 2 weeks. Patient c/o redness/warmth to left arm beginning yesterday.

## 2018-03-05 NOTE — ED Notes (Signed)
Pt has a hx of getting abscesses since a child. Has scar tissue under her arm with an abscess with slight drainage in the fold of the scar. She has a hardened area on her left forearm that is slightly warm and red, and she states her pinky is swollen and warm.

## 2018-03-05 NOTE — ED Notes (Signed)
Pharmacy called - cleocin 600 not in the pyxis. They will send.

## 2019-01-25 ENCOUNTER — Emergency Department (HOSPITAL_COMMUNITY): Payer: No Typology Code available for payment source

## 2019-01-25 ENCOUNTER — Other Ambulatory Visit: Payer: Self-pay

## 2019-01-25 ENCOUNTER — Encounter (HOSPITAL_COMMUNITY): Payer: Self-pay

## 2019-01-25 ENCOUNTER — Emergency Department (HOSPITAL_COMMUNITY)
Admission: EM | Admit: 2019-01-25 | Discharge: 2019-01-25 | Disposition: A | Discharge status: Home / Self Care | Payer: No Typology Code available for payment source | Attending: Emergency Medicine | Admitting: Emergency Medicine

## 2019-01-25 DIAGNOSIS — Z79899 Other long term (current) drug therapy: Secondary | ICD-10-CM | POA: Insufficient documentation

## 2019-01-25 DIAGNOSIS — F17200 Nicotine dependence, unspecified, uncomplicated: Secondary | ICD-10-CM | POA: Insufficient documentation

## 2019-01-25 DIAGNOSIS — S5012XA Contusion of left forearm, initial encounter: Secondary | ICD-10-CM | POA: Insufficient documentation

## 2019-01-25 DIAGNOSIS — Y999 Unspecified external cause status: Secondary | ICD-10-CM | POA: Diagnosis not present

## 2019-01-25 DIAGNOSIS — Y92410 Unspecified street and highway as the place of occurrence of the external cause: Secondary | ICD-10-CM | POA: Insufficient documentation

## 2019-01-25 DIAGNOSIS — S59912A Unspecified injury of left forearm, initial encounter: Secondary | ICD-10-CM | POA: Diagnosis present

## 2019-01-25 DIAGNOSIS — M79602 Pain in left arm: Secondary | ICD-10-CM

## 2019-01-25 DIAGNOSIS — Y939 Activity, unspecified: Secondary | ICD-10-CM | POA: Insufficient documentation

## 2019-01-25 MED ORDER — IBUPROFEN 800 MG PO TABS: 800.0000 mg | ORAL_TABLET | Freq: Three times a day (TID) | ORAL | 0 refills | Status: DC | PRN

## 2019-01-25 MED ORDER — HYDROCODONE-ACETAMINOPHEN 5-325 MG PO TABS: 1.0000 | ORAL_TABLET | Freq: Once | ORAL | Status: DC

## 2019-01-25 NOTE — Discharge Instructions (Signed)
Take Motrin for pain follow-up if any problems and stay at work until Wednesday
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
## 2020-04-07 ENCOUNTER — Emergency Department (HOSPITAL_COMMUNITY)
Admission: EM | Admit: 2020-04-07 | Discharge: 2020-04-07 | Disposition: A | Discharge status: Home / Self Care | Payer: 59 | Attending: Emergency Medicine | Admitting: Emergency Medicine

## 2020-04-07 ENCOUNTER — Other Ambulatory Visit: Payer: Self-pay

## 2020-04-07 ENCOUNTER — Encounter (HOSPITAL_COMMUNITY): Payer: Self-pay | Admitting: Emergency Medicine

## 2020-04-07 DIAGNOSIS — F17200 Nicotine dependence, unspecified, uncomplicated: Secondary | ICD-10-CM | POA: Diagnosis not present

## 2020-04-07 DIAGNOSIS — L02412 Cutaneous abscess of left axilla: Secondary | ICD-10-CM | POA: Diagnosis not present

## 2020-04-07 DIAGNOSIS — L0291 Cutaneous abscess, unspecified: Secondary | ICD-10-CM

## 2020-04-07 MED ORDER — DOXYCYCLINE HYCLATE 100 MG PO CAPS: 100.0000 mg | ORAL_CAPSULE | Freq: Two times a day (BID) | ORAL | 0 refills | Status: AC

## 2020-04-07 MED ORDER — BUPIVACAINE HCL (PF) 0.5 % IJ SOLN
10.0000 mL | Freq: Once | INTRAMUSCULAR | Status: AC
  Administered 2020-04-07: 10 mL
  Filled 2020-04-07: qty 30

## 2020-04-07 NOTE — ED Triage Notes (Signed)
Per pt, states pain under left arm-noticed a raised area-thinks it might be an abscess

## 2020-04-07 NOTE — ED Provider Notes (Signed)
[facility]PT Provider Note   CSN: [id number] Arrival date & time: 04/07/20  1057     History Chief Complaint  Patient presents with  . Abscess    Carly Atkins is a 24 y.o. female.  HPI Patient is a 24 year old female presented today with left armpit pain for approximately 3-4 days.  She states it is achy, constant, worse with touch and movement.  She states that she has had abscesses in the past.  She states that she does shave this area.  She states that he has had no fevers, chills, nausea or vomiting.  She states she otherwise feels well.  No aggravating or mitigating factors.  No significant associated symptoms.      Past Medical History:  Diagnosis Date  . Psoriasis   . Recurrent boils     Patient Active Problem List   Diagnosis Date Noted  . Positive ANA (antinuclear antibody) 09/04/2016  . Psoriasis 09/04/2016    History reviewed. No pertinent surgical history.   OB History   No obstetric history on file.     No family history on file.  Social History   Tobacco Use  . Smoking status: Current Every Day Smoker  . Smokeless tobacco: Never Used  Substance Use Topics  . Alcohol use: Yes  . Drug use: No    Home Medications Prior to Admission medications   Medication Sig Start Date End Date Taking? Authorizing Provider  cephALEXin (KEFLEX) 500 MG capsule Take 1 capsule (500 mg total) by mouth 3 (three) times daily. 09/04/16   Pollyann Savoy, MD  clindamycin (CLEOCIN) 300 MG capsule Take 1 capsule (300 mg total) by mouth 4 (four) times daily. 03/05/18   Cuthriell, Delorise Royals, PA-C  doxycycline (VIBRAMYCIN) 100 MG capsule Take 1 capsule (100 mg total) by mouth 2 (two) times daily for 7 days. 04/07/20 04/14/20  Gailen Shelter, PA  hydrocortisone cream 1 % Apply to affected area 2 times daily 12/24/17   Couture, Cortni S, PA-C  ibuprofen (ADVIL) 800 MG tablet Take 1 tablet (800 mg total) by mouth every 8 (eight) hours as  needed. 01/25/19   Bethann Berkshire, MD  triamcinolone ointment (KENALOG) 0.5 % Apply 1 application topically 2 (two) times daily. Patient not taking: Reported on 09/04/2016 03/18/16   Garnetta Buddy, PA  TRINESSA, 28, 0.18/0.215/0.25 MG-35 MCG tablet Take 1 tablet by mouth daily. 12/19/14   [provider]    Allergies    Zofran Frazier Richards hcl]  Review of Systems   Review of Systems  Constitutional: Negative for fever.  HENT: Negative for congestion.   Respiratory: Negative for shortness of breath.   Cardiovascular: Negative for chest pain.  Gastrointestinal: Negative for abdominal distention.  Skin:       Left armpit abscess  Neurological: Negative for dizziness and headaches.    Physical Exam Updated Vital Signs BP (!) 141/62   Pulse 89   Temp 98.1 F (36.7 C)   Resp 16   LMP 02/29/2020   SpO2 100%   Physical Exam Vitals and nursing note reviewed.  Constitutional:      General: She is not in acute distress.    Appearance: Normal appearance. She is not ill-appearing.  HENT:     Head: Normocephalic and atraumatic.  Eyes:     General: No scleral icterus.       Right eye: No discharge.        Left eye: No discharge.     Conjunctiva/sclera: Conjunctivae  normal.  Pulmonary:     Effort: Pulmonary effort is normal.     Breath sounds: No stridor.  Skin:    General: Skin is warm and dry.     Capillary Refill: Capillary refill takes less than 2 seconds.     Comments: 3.5 cm abscess that is fluctuant and tender to touch with scant mild indurated tissue surrounding located in the left axilla.  Neurological:     Mental Status: She is alert and oriented to person, place, and time. Mental status is at baseline.     ED Results / Procedures / Treatments   Labs (all labs ordered are listed, but only abnormal results are displayed) Labs Reviewed - No data to display  EKG None  Radiology No results found.  Procedures .Marland KitchenIncision and Drainage  Date/Time:  04/07/2020 6:12 PM Performed by: Gailen Shelter, PA Authorized by: Gailen Shelter, PA   Consent:    Consent obtained:  Verbal   Consent given by:  Patient   Risks discussed:  Bleeding, incomplete drainage, pain and damage to other organs   Alternatives discussed:  No treatment Universal protocol:    Procedure explained and questions answered to patient or proxy's satisfaction: yes     Relevant documents present and verified: yes     Test results available and properly labeled: yes     Imaging studies available: yes     Required blood products, implants, devices, and special equipment available: yes     Site/side marked: yes     Immediately prior to procedure a time out was called: yes     Patient identity confirmed:  Verbally with patient Location:    Type:  Abscess   Size:  3.5 cm   Location:  Upper extremity   Upper extremity location: armpit --  left. Pre-procedure details:    Skin preparation:  Betadine Anesthesia (see MAR for exact dosages):    Anesthesia method:  Local infiltration   Local anesthetic:  Bupivacaine 0.5% WITH epi Procedure type:    Complexity:  Complex Procedure details:    Incision types:  Single straight   Incision depth:  Subcutaneous   Scalpel blade:  11   Wound management:  Probed and deloculated, irrigated with saline and extensive cleaning   Drainage:  Purulent   Drainage amount:  Copious   Packing materials:  1/4 in iodoform gauze Post-procedure details:    Patient tolerance of procedure:  Tolerated well, no immediate complications Comments:     Patient tolerated procedure well.  Left axilla abscess was drained.   (including critical care time)  Medications Ordered in ED Medications  bupivacaine (MARCAINE) 0.5 % injection 10 mL (10 mLs Infiltration Given by Other 04/07/20 1750)    ED Course  I have reviewed the triage vital signs and the nursing notes.  Pertinent labs & imaging results that were available during my care of the  patient were reviewed by me and considered in my medical decision making (see chart for details).    MDM Rules/Calculators/A&P                          Patient is a 24 year old female presented today with left axilla pain.  She is found to have an abscess.  Incision and drainage was conducted.  It was successful.  No systemic symptoms.  His vital signs within normal limits.  Initially was severely hypertensive however I suspect that this was an accurate measurement as on  single recheck she has blood pressure but is only mildly hypertensive.  She has no other symptoms today.  Incision and drainage was successful and purulent drainage was obtained.  Some packing was used and patient will follow up in 2 days for wound recheck.  She tolerated procedure well.  Doxycycline prescribed.  Patient understands return precautions and therapy at this time.  Final Clinical Impression(s) / ED Diagnoses Final diagnoses:  Abscess  Abscess of axilla, left    Rx / DC Orders ED Discharge Orders         Ordered    doxycycline (VIBRAMYCIN) 100 MG capsule  2 times daily        04/07/20 1810           Solon Augusta [place], [place] 04/08/20 0011    Wynetta Fines, MD 04/11/20 1102

## 2020-04-07 NOTE — Discharge Instructions (Addendum)
Your left armpit abscess was incised and drained today.  I prescribed you doxycycline which you will take twice daily with food for the next 1 week. Please do plenty of water.  Do warm compresses to the area and keep the area clean.  Please follow-up with primary care doctor or return to ED for wound check in 2 days.  Please return sooner if you have any new or concerning symptoms.  Please use Tylenol or ibuprofen for pain.  You may use 600 mg ibuprofen every 6 hours or 1000 mg of Tylenol every 6 hours.  You may choose to alternate between the 2.  This would be most effective.  Not to exceed 4 g of Tylenol within 24 hours.  Not to exceed 3200 mg ibuprofen 24 hours.
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
## 2023-10-29 ENCOUNTER — Encounter (HOSPITAL_BASED_OUTPATIENT_CLINIC_OR_DEPARTMENT_OTHER): Payer: Self-pay

## 2023-10-29 ENCOUNTER — Emergency Department (HOSPITAL_BASED_OUTPATIENT_CLINIC_OR_DEPARTMENT_OTHER)
Admission: EM | Admit: 2023-10-29 | Discharge: 2023-10-30 | Disposition: A | Discharge status: Home / Self Care | Payer: Self-pay | Attending: Emergency Medicine | Admitting: Emergency Medicine

## 2023-10-29 ENCOUNTER — Other Ambulatory Visit: Payer: Self-pay

## 2023-10-29 DIAGNOSIS — L509 Urticaria, unspecified: Secondary | ICD-10-CM

## 2023-10-29 DIAGNOSIS — T7840XA Allergy, unspecified, initial encounter: Secondary | ICD-10-CM

## 2023-10-29 DIAGNOSIS — L500 Allergic urticaria: Secondary | ICD-10-CM | POA: Insufficient documentation

## 2023-10-29 LAB — CBC WITH DIFFERENTIAL/PLATELET
Abs Immature Granulocytes: 0.04 10*3/uL (ref 0.00–0.07)
Basophils Absolute: 0.1 10*3/uL (ref 0.0–0.1)
Basophils Relative: 1 %
Eosinophils Absolute: 0.1 10*3/uL (ref 0.0–0.5)
Eosinophils Relative: 1 %
HCT: 38.8 % (ref 36.0–46.0)
Hemoglobin: 12.7 g/dL (ref 12.0–15.0)
Immature Granulocytes: 0 %
Lymphocytes Relative: 44 %
Lymphs Abs: 4.8 10*3/uL — ABNORMAL HIGH (ref 0.7–4.0)
MCH: 29.1 pg (ref 26.0–34.0)
MCHC: 32.7 g/dL (ref 30.0–36.0)
MCV: 88.8 fL (ref 80.0–100.0)
Monocytes Absolute: 0.9 10*3/uL (ref 0.1–1.0)
Monocytes Relative: 8 %
Neutro Abs: 5 10*3/uL (ref 1.7–7.7)
Neutrophils Relative %: 46 %
Platelets: 561 10*3/uL — ABNORMAL HIGH (ref 150–400)
RBC: 4.37 MIL/uL (ref 3.87–5.11)
RDW: 13.3 % (ref 11.5–15.5)
WBC: 11 10*3/uL — ABNORMAL HIGH (ref 4.0–10.5)
nRBC: 0 % (ref 0.0–0.2)

## 2023-10-29 LAB — BASIC METABOLIC PANEL
Anion gap: 10 (ref 5–15)
BUN: 10 mg/dL (ref 6–20)
CO2: 21 mmol/L — ABNORMAL LOW (ref 22–32)
Calcium: 8.8 mg/dL — ABNORMAL LOW (ref 8.9–10.3)
Chloride: 104 mmol/L (ref 98–111)
Creatinine, Ser: 0.96 mg/dL (ref 0.44–1.00)
GFR, Estimated: >60 mL/min (ref >60)
Glucose, Bld: 88 mg/dL (ref 70–99)
Potassium: 3.4 mmol/L — ABNORMAL LOW (ref 3.5–5.1)
Sodium: 135 mmol/L (ref 135–145)

## 2023-10-29 LAB — PREGNANCY, URINE: Preg Test, Ur: NEGATIVE

## 2023-10-29 MED ORDER — DIPHENHYDRAMINE HCL 50 MG/ML IJ SOLN
50.0000 mg | Freq: Once | INTRAMUSCULAR | Status: AC
  Administered 2023-10-29: 50 mg via INTRAVENOUS
  Filled 2023-10-29: qty 1

## 2023-10-29 MED ORDER — SODIUM CHLORIDE 0.9 % IV BOLUS
1000.0000 mL | Freq: Once | INTRAVENOUS | Status: AC
  Administered 2023-10-29: 1000 mL via INTRAVENOUS

## 2023-10-29 MED ORDER — METHYLPREDNISOLONE SODIUM SUCC 125 MG IJ SOLR
125.0000 mg | Freq: Once | INTRAMUSCULAR | Status: AC
  Administered 2023-10-29: 125 mg via INTRAVENOUS
  Filled 2023-10-29: qty 2

## 2023-10-29 NOTE — ED Triage Notes (Signed)
 States she just had her eyelashes done and 1 hr after started itching and breaking out in hives Very anxious

## 2023-10-29 NOTE — ED Notes (Signed)
 Pt. In with allergic reaction to poss. Grapes or glue from eye lash placement.  Pt. Has noted red hives on various parts of her face and torso and her hands and upper thighs and abd.  Pt. In no Resp. Distress and has no reports of feeling like she has a swollen tongue. Pt. Has no drooling and is able to speak full sentences.

## 2023-10-29 NOTE — ED Provider Notes (Signed)
 [place] [facility] Provider Note   CSN: [id number] Arrival date & time: 10/29/23  2150     History {Add pertinent medical, surgical, social history, OB history to HPI:1} Chief Complaint  Patient presents with   Allergic Reaction    Carly Atkins is a 28 y.o. female.  Patient is a 28 year old female with no known allergies presenting for hives.  Patient states she was getting her eyelashes done when they left glue on her face and she began developing full body hives starting with her face.  She denies any shortness of breath or difficulty breathing.  Denies any wheezing, nausea, abdominal pain.  The history is provided by the patient. No language interpreter was used.  Allergic Reaction Presenting symptoms: rash        Home Medications Prior to Admission medications   Medication Sig Start Date End Date Taking? Authorizing Provider  cephALEXin (KEFLEX) 500 MG capsule Take 1 capsule (500 mg total) by mouth 3 (three) times daily. 09/04/16   Pollyann Savoy, MD  clindamycin (CLEOCIN) 300 MG capsule Take 1 capsule (300 mg total) by mouth 4 (four) times daily. 03/05/18   Cuthriell, Delorise Royals, PA-C  hydrocortisone cream 1 % Apply to affected area 2 times daily 12/24/17   Couture, Cortni S, PA-C  ibuprofen (ADVIL) 800 MG tablet Take 1 tablet (800 mg total) by mouth every 8 (eight) hours as needed. 01/25/19   Bethann Berkshire, MD  triamcinolone ointment (KENALOG) 0.5 % Apply 1 application topically 2 (two) times daily. Patient not taking: Reported on 09/04/2016 03/18/16   Garnetta Buddy, PA  TRINESSA, 28, 0.18/0.215/0.25 MG-35 MCG tablet Take 1 tablet by mouth daily. 12/19/14   [provider]      Allergies    Zofran Frazier Richards hcl]    Review of Systems   Review of Systems  Constitutional:  Negative for chills and fever.  HENT:  Positive for facial swelling. Negative for ear pain and sore throat.   Eyes:  Negative for pain and  visual disturbance.  Respiratory:  Negative for cough and shortness of breath.   Cardiovascular:  Negative for chest pain and palpitations.  Gastrointestinal:  Negative for abdominal pain and vomiting.  Genitourinary:  Negative for dysuria and hematuria.  Musculoskeletal:  Negative for arthralgias and back pain.  Skin:  Positive for rash. Negative for color change.  Neurological:  Negative for seizures and syncope.  All other systems reviewed and are negative.   Physical Exam Updated Vital Signs BP (!) 147/92 (BP Location: Left Arm)   Pulse (!) 103   Temp 98 F (36.7 C)   Resp 18   Ht 5\' 4"  (1.626 m)   Wt 95.3 kg   LMP 10/12/2023 (Exact Date)   SpO2 98%   BMI 36.05 kg/m  Physical Exam Vitals and nursing note reviewed.  Constitutional:      General: She is not in acute distress.    Appearance: She is well-developed.  HENT:     Head: Normocephalic and atraumatic.  Eyes:     Conjunctiva/sclera: Conjunctivae normal.  Cardiovascular:     Rate and Rhythm: Normal rate and regular rhythm.     Heart sounds: No murmur heard. Pulmonary:     Effort: Pulmonary effort is normal. No respiratory distress.     Breath sounds: Normal breath sounds.  Abdominal:     Palpations: Abdomen is soft.     Tenderness: There is no abdominal tenderness.  Musculoskeletal:  General: No swelling.     Cervical back: Neck supple.  Skin:    General: Skin is warm and dry.     Capillary Refill: Capillary refill takes less than 2 seconds.  Neurological:     Mental Status: She is alert.  Psychiatric:        Mood and Affect: Mood normal.     ED Results / Procedures / Treatments   Labs (all labs ordered are listed, but only abnormal results are displayed) Labs Reviewed  CBC WITH DIFFERENTIAL/PLATELET - Abnormal; Notable for the following components:      Result Value   WBC 11.0 (*)    Platelets 561 (*)    Lymphs Abs 4.8 (*)    All other components within normal limits  PREGNANCY, URINE   BASIC METABOLIC PANEL    EKG None  Radiology No results found.  Procedures Procedures  {Document cardiac monitor, telemetry assessment procedure when appropriate:1}  Medications Ordered in ED Medications  sodium chloride 0.9 % bolus 1,000 mL (has no administration in time range)  diphenhydrAMINE (BENADRYL) injection 50 mg (has no administration in time range)  methylPREDNISolone sodium succinate (SOLU-MEDROL) 125 mg/2 mL injection 125 mg (has no administration in time range)    ED Course/ Medical Decision Making/ A&P   {   Click here for ABCD2, HEART and other calculatorsREFRESH Note before signing :1}                              Medical Decision Making Amount and/or Complexity of Data Reviewed Labs: ordered.  Risk Prescription drug management.   ***  {Document critical care time when appropriate:1} {Document review of labs and clinical decision tools ie heart score, Chads2Vasc2 etc:1}  {Document your independent review of radiology images, and any outside records:1} {Document your discussion with family members, caretakers, and with consultants:1} {Document social determinants of health affecting pt's care:1} {Document your decision making why or why not admission, treatments were needed:1} Final Clinical Impression(s) / ED Diagnoses Final diagnoses:  None    Rx / DC Orders ED Discharge Orders     None

## 2023-10-30 MED ORDER — PREDNISONE 20 MG PO TABS: 20.0000 mg | ORAL_TABLET | Freq: Every day | ORAL | 0 refills | Status: AC

## 2023-10-30 MED ORDER — LORATADINE 10 MG PO TABS: 10.0000 mg | ORAL_TABLET | Freq: Every day | ORAL | 0 refills | Status: DC

## 2023-12-25 ENCOUNTER — Emergency Department (HOSPITAL_BASED_OUTPATIENT_CLINIC_OR_DEPARTMENT_OTHER)
Admission: EM | Admit: 2023-12-25 | Discharge: 2023-12-25 | Disposition: A | Discharge status: Home / Self Care | Payer: Self-pay | Attending: Emergency Medicine | Admitting: Emergency Medicine

## 2023-12-25 ENCOUNTER — Other Ambulatory Visit: Payer: Self-pay

## 2023-12-25 ENCOUNTER — Emergency Department (HOSPITAL_BASED_OUTPATIENT_CLINIC_OR_DEPARTMENT_OTHER): Payer: Self-pay

## 2023-12-25 ENCOUNTER — Encounter (HOSPITAL_BASED_OUTPATIENT_CLINIC_OR_DEPARTMENT_OTHER): Payer: Self-pay | Admitting: Emergency Medicine

## 2023-12-25 DIAGNOSIS — O4690 Antepartum hemorrhage, unspecified, unspecified trimester: Secondary | ICD-10-CM

## 2023-12-25 DIAGNOSIS — O4691 Antepartum hemorrhage, unspecified, first trimester: Secondary | ICD-10-CM | POA: Insufficient documentation

## 2023-12-25 LAB — CBC WITH DIFFERENTIAL/PLATELET
Abs Immature Granulocytes: 0.05 10*3/uL (ref 0.00–0.07)
Basophils Absolute: 0.1 10*3/uL (ref 0.0–0.1)
Basophils Relative: 1 %
Eosinophils Absolute: 0.2 10*3/uL (ref 0.0–0.5)
Eosinophils Relative: 2 %
HCT: 34.8 % — ABNORMAL LOW (ref 36.0–46.0)
Hemoglobin: 11.3 g/dL — ABNORMAL LOW (ref 12.0–15.0)
Immature Granulocytes: 1 %
Lymphocytes Relative: 21 %
Lymphs Abs: 2.2 10*3/uL (ref 0.7–4.0)
MCH: 29.4 pg (ref 26.0–34.0)
MCHC: 32.5 g/dL (ref 30.0–36.0)
MCV: 90.4 fL (ref 80.0–100.0)
Monocytes Absolute: 0.8 10*3/uL (ref 0.1–1.0)
Monocytes Relative: 8 %
Neutro Abs: 7.2 10*3/uL (ref 1.7–7.7)
Neutrophils Relative %: 67 %
Platelets: 466 10*3/uL — ABNORMAL HIGH (ref 150–400)
RBC: 3.85 MIL/uL — ABNORMAL LOW (ref 3.87–5.11)
RDW: 14 % (ref 11.5–15.5)
WBC: 10.4 10*3/uL (ref 4.0–10.5)
nRBC: 0 % (ref 0.0–0.2)

## 2023-12-25 LAB — COMPREHENSIVE METABOLIC PANEL WITH GFR
ALT: 6 U/L (ref 0–44)
AST: 13 U/L — ABNORMAL LOW (ref 15–41)
Albumin: 3.6 g/dL (ref 3.5–5.0)
Alkaline Phosphatase: 80 U/L (ref 38–126)
Anion gap: 12 (ref 5–15)
BUN: 9 mg/dL (ref 6–20)
CO2: 20 mmol/L — ABNORMAL LOW (ref 22–32)
Calcium: 9.2 mg/dL (ref 8.9–10.3)
Chloride: 103 mmol/L (ref 98–111)
Creatinine, Ser: 0.63 mg/dL (ref 0.44–1.00)
GFR, Estimated: >60 mL/min (ref >60)
Glucose, Bld: 99 mg/dL (ref 70–99)
Potassium: 3.7 mmol/L (ref 3.5–5.1)
Sodium: 135 mmol/L (ref 135–145)
Total Bilirubin: <0.2 mg/dL (ref 0.0–1.2)
Total Protein: 7.3 g/dL (ref 6.5–8.1)

## 2023-12-25 LAB — URINALYSIS, ROUTINE W REFLEX MICROSCOPIC
Bilirubin Urine: NEGATIVE
Glucose, UA: NEGATIVE mg/dL
Ketones, ur: NEGATIVE mg/dL
Leukocytes,Ua: NEGATIVE
Nitrite: NEGATIVE
Protein, ur: NEGATIVE mg/dL
Specific Gravity, Urine: 1.015 (ref 1.005–1.030)
pH: 7 (ref 5.0–8.0)

## 2023-12-25 LAB — URINALYSIS, MICROSCOPIC (REFLEX): WBC, UA: NONE SEEN WBC/hpf (ref 0–5)

## 2023-12-25 LAB — HCG, QUANTITATIVE, PREGNANCY: hCG, Beta Chain, Quant, S: 45781 m[IU]/mL — ABNORMAL HIGH (ref <5)

## 2023-12-25 NOTE — ED Triage Notes (Signed)
 [redacted] weeks pregnant , started vaginal bleeding and passing clots this morning .  1 pad this morning . Denies abd pain or back pain .

## 2023-12-25 NOTE — Discharge Instructions (Addendum)
 It is unclear as to the cause of your vaginal bleeding today.  However, your ultrasound showed a healthy intrauterine pregnancy at about 6 weeks and 4 days. I have included your ultrasound result below.  Please continue taking a prenatal daily vitamin.  Please follow-up with your OB/GYN within the next 2 to 3 weeks for routine care.  If you have any return of concerning symptoms such as vaginal bleeding or abdominal pain, please go directly to the women and children's unit at [facility].  You may walk-in without an appointment.  They are located at [address] [place], [place], [place] [address].   Your hemoglobin which is one of your blood counts is slightly low here today.  Please have this monitored with your PCP or OB/GYN.  Return to the ER for any other emergent concerns.  "EXAM: OBSTETRIC <14 WK US  AND TRANSVAGINAL OB US    TECHNIQUE: Both transabdominal and transvaginal ultrasound examinations were performed for complete evaluation of the gestation as well as the maternal uterus, adnexal regions, and pelvic cul-de-sac. Transvaginal technique was performed to assess early pregnancy.   COMPARISON:  None Available.   FINDINGS: Intrauterine gestational sac: Present   Yolk sac:  Present   Embryo:  Present   Cardiac Activity: Present   Heart Rate: 113 bpm   CRL:  6.9 mm   6 w   4 d                  US  EDC: 08/15/2024   Subchorionic hemorrhage:  None visualized.   Maternal uterus/adnexae: Intramural uterine fibroid in the posterior body, measuring 3.2 x 2.4 x 3 cm. Neither ovary is enlarged. Likely corpus luteum in the left ovary measuring 2.3 cm. No free pelvic fluid.   IMPRESSION: Single, live intrauterine gestation with an estimated gestational age of [redacted] weeks and 4 days. Fetal heart rate of 113 beats per minute. Continued routine obstetric and sonographic follow-up is recommended.     Electronically Signed   By: Rance Burrows M.D.   On: 12/25/2023 14:30"

## 2023-12-25 NOTE — ED Notes (Signed)
 Introduced self to patient and updated vital signs. Told her the next steps and that we will wait for the ultrasound to be read by the radiologist.

## 2023-12-25 NOTE — ED Provider Notes (Signed)
  [facility] Provider Note   CSN: [id number] Arrival date & time: 12/25/23  0857     History  Chief Complaint  Patient presents with   Vaginal Bleeding    Carly Atkins is a 28 y.o. female with no significant past medical history presents with a concern for vaginal bleeding in the setting of pregnancy.  Reports bleeding started this morning and she also noticed some passage of clots.  She estimates she is about 7 weeks, she is unsure of her LMP.  Denies any abdominal pain, fevers, chills.   Vaginal Bleeding      Home Medications Prior to Admission medications   Medication Sig Start Date End Date Taking? Authorizing Provider  cephALEXin  (KEFLEX ) 500 MG capsule Take 1 capsule (500 mg total) by mouth 3 (three) times daily. 09/04/16   Nicholas Bari, MD  clindamycin  (CLEOCIN ) 300 MG capsule Take 1 capsule (300 mg total) by mouth 4 (four) times daily. 03/05/18   Cuthriell, Ardath Bears, PA-C  hydrocortisone  cream 1 % Apply to affected area 2 times daily 12/24/17   Couture, Cortni S, PA-C  ibuprofen  (ADVIL ) 800 MG tablet Take 1 tablet (800 mg total) by mouth every 8 (eight) hours as needed. 01/25/19   Cheyenne Cotta, MD  loratadine  (CLARITIN ) 10 MG tablet Take 1 tablet (10 mg total) by mouth daily. 10/30/23   Gray, Alicia P, DO  triamcinolone  ointment (KENALOG ) 0.5 % Apply 1 application topically 2 (two) times daily. Patient not taking: Reported on 09/04/2016 03/18/16   Suzanna Erp, PA  TRINESSA, 28, 0.18/0.215/0.25 MG-35 MCG tablet Take 1 tablet by mouth daily. 12/19/14   [provider]      Allergies    Zofran  [ondansetron  hcl]    Review of Systems   Review of Systems  Genitourinary:  Positive for vaginal bleeding.    Physical Exam Updated Vital Signs BP (!) 144/94 (BP Location: Right Arm)   Pulse 92   Temp 98.5 F (36.9 C) (Oral)   Resp 18   Wt 117.9 kg   LMP  (Exact Date)   SpO2 100%   BMI 44.63 kg/m   Physical Exam Vitals and nursing note reviewed.  Constitutional:      Appearance: Normal appearance.     Comments: Anxious appearing, tearful  HENT:     Head: Atraumatic.  Cardiovascular:     Rate and Rhythm: Regular rhythm. Tachycardia present.  Pulmonary:     Effort: Pulmonary effort is normal.  Abdominal:     General: Abdomen is flat.     Palpations: Abdomen is soft.     Tenderness: There is no abdominal tenderness.     Comments: Abdomen soft and nontender to palpation  Genitourinary:    Comments: Declines Neurological:     General: No focal deficit present.     Mental Status: She is alert.  Psychiatric:        Mood and Affect: Mood normal.        Behavior: Behavior normal.     ED Results / Procedures / Treatments   Labs (all labs ordered are listed, but only abnormal results are displayed) Labs Reviewed  CBC WITH DIFFERENTIAL/PLATELET - Abnormal; Notable for the following components:      Result Value   RBC 3.85 (*)    Hemoglobin 11.3 (*)    HCT 34.8 (*)    Platelets 466 (*)    All other components within normal limits  COMPREHENSIVE METABOLIC PANEL WITH GFR -  Abnormal; Notable for the following components:   CO2 20 (*)    AST 13 (*)    All other components within normal limits  HCG, QUANTITATIVE, PREGNANCY - Abnormal; Notable for the following components:   hCG, Beta Chain, Quant, S 45,781 (*)    All other components within normal limits  URINALYSIS, ROUTINE W REFLEX MICROSCOPIC - Abnormal; Notable for the following components:   Hgb urine dipstick LARGE (*)    All other components within normal limits  URINALYSIS, MICROSCOPIC (REFLEX) - Abnormal; Notable for the following components:   Bacteria, UA RARE (*)    All other components within normal limits  ABO/RH    EKG None  Radiology US  OB LESS THAN 14 WEEKS WITH OB TRANSVAGINAL Result Date: 12/25/2023 CLINICAL DATA:  [id number] Vaginal bleeding during pregnancy [id number] EXAM: OBSTETRIC <14 WK US  AND  TRANSVAGINAL OB US  TECHNIQUE: Both transabdominal and transvaginal ultrasound examinations were performed for complete evaluation of the gestation as well as the maternal uterus, adnexal regions, and pelvic cul-de-sac. Transvaginal technique was performed to assess early pregnancy. COMPARISON:  None Available. FINDINGS: Intrauterine gestational sac: Present Yolk sac:  Present Embryo:  Present Cardiac Activity: Present Heart Rate: 113 bpm CRL:  6.9 mm   6 w   4 d                  US  EDC: 08/15/2024 Subchorionic hemorrhage:  None visualized. Maternal uterus/adnexae: Intramural uterine fibroid in the posterior body, measuring 3.2 x 2.4 x 3 cm. Neither ovary is enlarged. Likely corpus luteum in the left ovary measuring 2.3 cm. No free pelvic fluid. IMPRESSION: Single, live intrauterine gestation with an estimated gestational age of [redacted] weeks and 4 days. Fetal heart rate of 113 beats per minute. Continued routine obstetric and sonographic follow-up is recommended. Electronically Signed   By: Rance Burrows M.D.   On: 12/25/2023 14:30    Procedures Procedures    Medications Ordered in ED Medications - No data to display  ED Course/ Medical Decision Making/ A&P Clinical Course as of 12/25/23 1500  Thu Dec 25, 2023  1404 Patient rechecked, states bleeding has stopped.  Still awaiting ultrasound results. [AF]    Clinical Course User Index [AF] Rexie Catena, PA-C                                 Medical Decision Making Amount and/or Complexity of Data Reviewed Labs: ordered. Radiology: ordered.     Differential diagnosis includes but is not limited to UTI, pregnancy related concerns in females of childbearing age    ED Course:  Upon initial evaluation, patient is tearful, but no acute distress.  Abdomen soft nontender.  Slightly tachycardic to 102, but feel this is likely due to her anxiousness.  Denies any other sick symptoms such as fever.  Labs Ordered: I Ordered, and personally  interpreted labs.  The pertinent results include:   CBC without leukocytosis.  Hemoglobin low at 11.3 CMP unremarkable Beta-hCG at 45,781 Urinalysis without signs of infection  Imaging Studies ordered: I ordered imaging studies including transvaginal ultrasound I independently visualized the imaging with scope of interpretation limited to determining acute life threatening conditions related to emergency care. Imaging showed  Single intrauterine pregnancy dated at 6 weeks and 4 days.  Fetal heart rate 113.  No subchorionic hemorrhage noted. I agree with the radiologist interpretation   Upon re-evaluation, patient remains well-appearing, stable vital signs aside  from slightly elevated blood pressure of 144/94.  She reports her bleeding has stopped.  Her ultrasound shows healthy single intrauterine pregnancy.  No concern for miscarriage at this time.  Urine without signs of infection.  Hemoglobin slightly low at 11.3, but no indication for transfusion.  Will have her monitor this with PCP.    Impression: Vaginal bleeding in pregnancy  Disposition:  The patient was discharged home with instructions to take daily prenatal vitamin.  Follow-up with her OB/GYN within the next 2 weeks for routine care.  Have her OB/GYN or PCP monitor her hemoglobin. Return precautions given.    This chart was dictated using voice recognition software, Dragon. Despite the best efforts of this provider to proofread and correct errors, errors may still occur which can change documentation meaning.          Final Clinical Impression(s) / ED Diagnoses Final diagnoses:  Vaginal bleeding in pregnancy    Rx / DC Orders ED Discharge Orders     None         Rexie Catena, PA-C 12/25/23 1500    Hershel Los, MD 12/26/23 [phone]

## 2023-12-26 LAB — ABO/RH: ABO/RH(D): B POS

## 2024-02-03 LAB — OB RESULTS CONSOLE GC/CHLAMYDIA
Chlamydia: NEGATIVE
Neisseria Gonorrhea: NEGATIVE

## 2024-02-03 LAB — OB RESULTS CONSOLE RPR: RPR: NONREACTIVE

## 2024-02-03 LAB — OB RESULTS CONSOLE RUBELLA ANTIBODY, IGM: Rubella: IMMUNE

## 2024-02-03 LAB — HEPATITIS C ANTIBODY: HCV Ab: NEGATIVE

## 2024-02-03 LAB — OB RESULTS CONSOLE ANTIBODY SCREEN: Antibody Screen: NEGATIVE

## 2024-02-03 LAB — OB RESULTS CONSOLE HEPATITIS B SURFACE ANTIGEN: Hepatitis B Surface Ag: NEGATIVE

## 2024-02-03 LAB — OB RESULTS CONSOLE HIV ANTIBODY (ROUTINE TESTING): HIV: NONREACTIVE

## 2024-02-25 ENCOUNTER — Encounter: Payer: Self-pay | Admitting: Family Medicine

## 2024-07-01 ENCOUNTER — Other Ambulatory Visit (HOSPITAL_COMMUNITY): Payer: Self-pay | Admitting: Obstetrics and Gynecology

## 2024-07-01 DIAGNOSIS — O99013 Anemia complicating pregnancy, third trimester: Secondary | ICD-10-CM | POA: Insufficient documentation

## 2024-07-01 DIAGNOSIS — D509 Iron deficiency anemia, unspecified: Secondary | ICD-10-CM | POA: Insufficient documentation

## 2024-07-02 ENCOUNTER — Telehealth (HOSPITAL_COMMUNITY): Payer: Self-pay | Admitting: Pharmacy Technician

## 2024-07-02 NOTE — Telephone Encounter (Signed)
 Auth Submission: APPROVED Site of care: [facility] Payer: [employer] [place] Medication & CPT/J Code(s) submitted: Feraheme (ferumoxytol) [id number] Diagnosis Code: D50.9, O99.013 Route of submission (phone, fax, portal): PHONE Phone # [phone] Fax # [phone] Auth type: Buy/Bill HB Units/visits requested: 510mg  x 2 doses Reference number: [id number] Approval from:  07/02/24 to 07/01/25   [name], CPhT [name] Phone: [phone] 07/02/2024

## 2024-07-15 ENCOUNTER — Encounter (HOSPITAL_COMMUNITY)
Admission: RE | Admit: 2024-07-15 | Discharge: 2024-07-15 | Disposition: A | Discharge status: Home / Self Care | Payer: Self-pay | Source: Ambulatory Visit | Attending: Obstetrics and Gynecology | Admitting: Obstetrics and Gynecology

## 2024-07-15 VITALS — BP 139/92 | HR 101 | Temp 98.7°F | Resp 17

## 2024-07-15 DIAGNOSIS — O99013 Anemia complicating pregnancy, third trimester: Secondary | ICD-10-CM | POA: Diagnosis present

## 2024-07-15 DIAGNOSIS — D509 Iron deficiency anemia, unspecified: Secondary | ICD-10-CM | POA: Diagnosis present

## 2024-07-15 MED ORDER — SODIUM CHLORIDE 0.9 % IV SOLN
510.0000 mg | Freq: Once | INTRAVENOUS | Status: AC
  Administered 2024-07-15: 510 mg via INTRAVENOUS
  Filled 2024-07-15: qty 510

## 2024-07-21 ENCOUNTER — Encounter (HOSPITAL_COMMUNITY): Payer: Self-pay | Admitting: Obstetrics and Gynecology

## 2024-07-22 ENCOUNTER — Encounter (HOSPITAL_COMMUNITY): Payer: Self-pay | Admitting: Obstetrics and Gynecology

## 2024-07-22 ENCOUNTER — Inpatient Hospital Stay (HOSPITAL_COMMUNITY): Admission: RE | Admit: 2024-07-22 | Payer: Self-pay | Source: Ambulatory Visit

## 2024-07-23 LAB — OB RESULTS CONSOLE GBS: GBS: POSITIVE

## 2024-07-28 ENCOUNTER — Encounter (HOSPITAL_COMMUNITY): Payer: Self-pay | Admitting: *Deleted

## 2024-07-28 ENCOUNTER — Telehealth (HOSPITAL_COMMUNITY): Payer: Self-pay | Admitting: *Deleted

## 2024-07-28 NOTE — Telephone Encounter (Signed)
 Preadmission screen

## 2024-08-03 ENCOUNTER — Other Ambulatory Visit: Payer: Self-pay

## 2024-08-03 ENCOUNTER — Encounter (HOSPITAL_COMMUNITY): Payer: Self-pay | Admitting: Obstetrics

## 2024-08-03 ENCOUNTER — Inpatient Hospital Stay (HOSPITAL_COMMUNITY)

## 2024-08-03 ENCOUNTER — Inpatient Hospital Stay (HOSPITAL_COMMUNITY)
Admission: RE | Admit: 2024-08-03 | Discharge: 2024-08-07 | Disposition: A | Discharge status: Home / Self Care | Payer: Self-pay

## 2024-08-03 DIAGNOSIS — E66813 Obesity, class 3: Secondary | ICD-10-CM | POA: Diagnosis present

## 2024-08-03 DIAGNOSIS — Z3A38 38 weeks gestation of pregnancy: Secondary | ICD-10-CM | POA: Diagnosis not present

## 2024-08-03 DIAGNOSIS — Z8249 Family history of ischemic heart disease and other diseases of the circulatory system: Secondary | ICD-10-CM

## 2024-08-03 DIAGNOSIS — O1092 Unspecified pre-existing hypertension complicating childbirth: Secondary | ICD-10-CM | POA: Diagnosis present

## 2024-08-03 DIAGNOSIS — O99892 Other specified diseases and conditions complicating childbirth: Secondary | ICD-10-CM | POA: Diagnosis present

## 2024-08-03 DIAGNOSIS — O99824 Streptococcus B carrier state complicating childbirth: Secondary | ICD-10-CM | POA: Diagnosis present

## 2024-08-03 DIAGNOSIS — O9972 Diseases of the skin and subcutaneous tissue complicating childbirth: Secondary | ICD-10-CM | POA: Diagnosis present

## 2024-08-03 DIAGNOSIS — Z833 Family history of diabetes mellitus: Secondary | ICD-10-CM | POA: Diagnosis not present

## 2024-08-03 DIAGNOSIS — Z87891 Personal history of nicotine dependence: Secondary | ICD-10-CM | POA: Diagnosis not present

## 2024-08-03 DIAGNOSIS — O99214 Obesity complicating childbirth: Secondary | ICD-10-CM | POA: Diagnosis present

## 2024-08-03 DIAGNOSIS — Z79899 Other long term (current) drug therapy: Secondary | ICD-10-CM

## 2024-08-03 DIAGNOSIS — R7303 Prediabetes: Secondary | ICD-10-CM | POA: Diagnosis present

## 2024-08-03 DIAGNOSIS — O9902 Anemia complicating childbirth: Secondary | ICD-10-CM | POA: Diagnosis present

## 2024-08-03 DIAGNOSIS — O10013 Pre-existing essential hypertension complicating pregnancy, third trimester: Principal | ICD-10-CM | POA: Diagnosis present

## 2024-08-03 DIAGNOSIS — O1002 Pre-existing essential hypertension complicating childbirth: Principal | ICD-10-CM | POA: Diagnosis present

## 2024-08-03 DIAGNOSIS — L732 Hidradenitis suppurativa: Secondary | ICD-10-CM | POA: Diagnosis present

## 2024-08-03 MED ORDER — OXYCODONE-ACETAMINOPHEN 5-325 MG PO TABS: 2.0000 | ORAL_TABLET | ORAL | Status: DC | PRN

## 2024-08-03 MED ORDER — SOD CITRATE-CITRIC ACID 500-334 MG/5ML PO SOLN
30.0000 mL | ORAL | Status: DC | PRN
  Administered 2024-08-05: 30 mL via ORAL
  Filled 2024-08-03: qty 30

## 2024-08-03 MED ORDER — LACTATED RINGERS IV SOLN: 500.0000 mL | INTRAVENOUS | Status: AC | PRN

## 2024-08-03 MED ORDER — OXYTOCIN-SODIUM CHLORIDE 30-0.9 UT/500ML-% IV SOLN: 2.5000 [IU]/h | INTRAVENOUS | Status: DC

## 2024-08-03 MED ORDER — OXYCODONE-ACETAMINOPHEN 5-325 MG PO TABS: 1.0000 | ORAL_TABLET | ORAL | Status: DC | PRN

## 2024-08-03 MED ORDER — SODIUM CHLORIDE 0.9 % IV SOLN
5.0000 10*6.[IU] | Freq: Once | INTRAVENOUS | Status: AC
  Administered 2024-08-03: 5 10*6.[IU] via INTRAVENOUS
  Filled 2024-08-03: qty 5

## 2024-08-03 MED ORDER — LACTATED RINGERS IV SOLN: INTRAVENOUS | Status: AC

## 2024-08-03 MED ORDER — FENTANYL CITRATE (PF) 100 MCG/2ML IJ SOLN
50.0000 ug | INTRAMUSCULAR | Status: DC | PRN
  Administered 2024-08-04: 100 ug via INTRAVENOUS
  Administered 2024-08-04: 50 ug via INTRAVENOUS
  Administered 2024-08-04: 100 ug via INTRAVENOUS
  Filled 2024-08-03 (×3): qty 2

## 2024-08-03 MED ORDER — LIDOCAINE HCL (PF) 1 % IJ SOLN: 30.0000 mL | INTRAMUSCULAR | Status: DC | PRN

## 2024-08-03 MED ORDER — OXYTOCIN BOLUS FROM INFUSION: 333.0000 mL | Freq: Once | INTRAVENOUS | Status: DC

## 2024-08-03 MED ORDER — MISOPROSTOL 25 MCG QUARTER TABLET
25.0000 ug | ORAL_TABLET | ORAL | Status: DC | PRN
  Administered 2024-08-03 – 2024-08-04 (×2): 25 ug via VAGINAL
  Filled 2024-08-03 (×3): qty 1

## 2024-08-03 MED ORDER — ACETAMINOPHEN 325 MG PO TABS: 650.0000 mg | ORAL_TABLET | ORAL | Status: DC | PRN

## 2024-08-03 MED ORDER — PENICILLIN G POT IN DEXTROSE 60000 UNIT/ML IV SOLN
3.0000 10*6.[IU] | INTRAVENOUS | Status: DC
  Administered 2024-08-04 – 2024-08-05 (×5): 3 10*6.[IU] via INTRAVENOUS
  Filled 2024-08-03 (×5): qty 50

## 2024-08-03 MED ORDER — TERBUTALINE SULFATE 1 MG/ML IJ SOLN: 0.2500 mg | Freq: Once | INTRAMUSCULAR | Status: DC | PRN

## 2024-08-04 ENCOUNTER — Inpatient Hospital Stay (HOSPITAL_COMMUNITY): Admitting: Anesthesiology

## 2024-08-04 LAB — COMPREHENSIVE METABOLIC PANEL WITH GFR
ALT: 12 U/L (ref 0–44)
ALT: 10 U/L (ref 0–44)
AST: 16 U/L (ref 15–41)
AST: 16 U/L (ref 15–41)
Albumin: 3.2 g/dL — ABNORMAL LOW (ref 3.5–5.0)
Albumin: 3.2 g/dL — ABNORMAL LOW (ref 3.5–5.0)
Alkaline Phosphatase: 202 U/L — ABNORMAL HIGH (ref 38–126)
Alkaline Phosphatase: 190 U/L — ABNORMAL HIGH (ref 38–126)
Anion gap: 12 (ref 5–15)
Anion gap: 12 (ref 5–15)
BUN: <5 mg/dL — ABNORMAL LOW (ref 6–20)
BUN: <5 mg/dL — ABNORMAL LOW (ref 6–20)
CO2: 20 mmol/L — ABNORMAL LOW (ref 22–32)
CO2: 20 mmol/L — ABNORMAL LOW (ref 22–32)
Calcium: 9 mg/dL (ref 8.9–10.3)
Calcium: 8.8 mg/dL — ABNORMAL LOW (ref 8.9–10.3)
Chloride: 102 mmol/L (ref 98–111)
Chloride: 102 mmol/L (ref 98–111)
Creatinine, Ser: 0.52 mg/dL (ref 0.44–1.00)
Creatinine, Ser: 0.55 mg/dL (ref 0.44–1.00)
GFR, Estimated: >60 mL/min
GFR, Estimated: >60 mL/min
Glucose, Bld: 108 mg/dL — ABNORMAL HIGH (ref 70–99)
Glucose, Bld: 70 mg/dL (ref 70–99)
Potassium: 3.8 mmol/L (ref 3.5–5.1)
Potassium: 3.5 mmol/L (ref 3.5–5.1)
Sodium: 134 mmol/L — ABNORMAL LOW (ref 135–145)
Sodium: 134 mmol/L — ABNORMAL LOW (ref 135–145)
Total Bilirubin: 0.3 mg/dL (ref 0.0–1.2)
Total Bilirubin: 0.4 mg/dL (ref 0.0–1.2)
Total Protein: 6.9 g/dL (ref 6.5–8.1)
Total Protein: 7.5 g/dL (ref 6.5–8.1)

## 2024-08-04 LAB — CBC
HCT: 31.5 % — ABNORMAL LOW (ref 36.0–46.0)
HCT: 33.2 % — ABNORMAL LOW (ref 36.0–46.0)
Hemoglobin: 10.1 g/dL — ABNORMAL LOW (ref 12.0–15.0)
Hemoglobin: 10.8 g/dL — ABNORMAL LOW (ref 12.0–15.0)
MCH: 27.7 pg (ref 26.0–34.0)
MCH: 28.1 pg (ref 26.0–34.0)
MCHC: 32.1 g/dL (ref 30.0–36.0)
MCHC: 32.5 g/dL (ref 30.0–36.0)
MCV: 86.2 fL (ref 80.0–100.0)
MCV: 86.5 fL (ref 80.0–100.0)
Platelets: 484 K/uL — ABNORMAL HIGH (ref 150–400)
Platelets: 524 K/uL — ABNORMAL HIGH (ref 150–400)
RBC: 3.64 MIL/uL — ABNORMAL LOW (ref 3.87–5.11)
RBC: 3.85 MIL/uL — ABNORMAL LOW (ref 3.87–5.11)
RDW: 15.1 % (ref 11.5–15.5)
RDW: 15.1 % (ref 11.5–15.5)
WBC: 10.4 K/uL (ref 4.0–10.5)
WBC: 11.2 K/uL — ABNORMAL HIGH (ref 4.0–10.5)
nRBC: 0 % (ref 0.0–0.2)
nRBC: 0 % (ref 0.0–0.2)

## 2024-08-04 LAB — PROTEIN / CREATININE RATIO, URINE
Creatinine, Urine: 225 mg/dL
Protein Creatinine Ratio: 0.2 mg/mg — ABNORMAL HIGH
Total Protein, Urine: 44 mg/dL

## 2024-08-04 LAB — TYPE AND SCREEN
ABO/RH(D): B POS
Antibody Screen: NEGATIVE

## 2024-08-04 LAB — SYPHILIS: RPR W/REFLEX TO RPR TITER AND TREPONEMAL ANTIBODIES, TRADITIONAL SCREENING AND DIAGNOSIS ALGORITHM: RPR Ser Ql: NONREACTIVE

## 2024-08-04 MED ORDER — TERBUTALINE SULFATE 1 MG/ML IJ SOLN
0.2500 mg | Freq: Once | INTRAMUSCULAR | Status: AC | PRN
  Administered 2024-08-05: 0.25 mg via SUBCUTANEOUS
  Filled 2024-08-04: qty 1

## 2024-08-04 MED ORDER — OXYTOCIN-SODIUM CHLORIDE 30-0.9 UT/500ML-% IV SOLN
1.0000 m[IU]/min | INTRAVENOUS | Status: DC
  Administered 2024-08-04: 2 m[IU]/min via INTRAVENOUS
  Filled 2024-08-04: qty 500

## 2024-08-04 MED ORDER — DIPHENHYDRAMINE HCL 50 MG/ML IJ SOLN: 12.5000 mg | INTRAMUSCULAR | Status: DC | PRN

## 2024-08-04 MED ORDER — EPHEDRINE 5 MG/ML INJ: 10.0000 mg | INTRAVENOUS | Status: DC | PRN

## 2024-08-04 MED ORDER — NIFEDIPINE ER OSMOTIC RELEASE 30 MG PO TB24
30.0000 mg | ORAL_TABLET | Freq: Every day | ORAL | Status: DC
  Administered 2024-08-04: 30 mg via ORAL
  Filled 2024-08-04: qty 1

## 2024-08-04 MED ORDER — LIDOCAINE HCL (PF) 1 % IJ SOLN
INTRAMUSCULAR | Status: DC | PRN
  Administered 2024-08-04: 10 mL via EPIDURAL

## 2024-08-04 MED ORDER — FENTANYL-BUPIVACAINE-NACL 0.5-0.125-0.9 MG/250ML-% EP SOLN
12.0000 mL/h | EPIDURAL | Status: DC | PRN
  Administered 2024-08-04: 12 mL/h via EPIDURAL
  Filled 2024-08-04: qty 250

## 2024-08-04 MED ORDER — LACTATED RINGERS IV SOLN: INTRAVENOUS | Status: DC

## 2024-08-04 MED ORDER — LACTATED RINGERS IV SOLN
500.0000 mL | Freq: Once | INTRAVENOUS | Status: AC
  Administered 2024-08-04: 500 mL via INTRAVENOUS

## 2024-08-04 MED ORDER — MAGNESIUM SULFATE BOLUS VIA INFUSION
4.0000 g | Freq: Once | INTRAVENOUS | Status: DC
  Filled 2024-08-04: qty 1000

## 2024-08-04 MED ORDER — MAGNESIUM SULFATE 40 GM/1000ML IV SOLN: 2.0000 g/h | INTRAVENOUS | Status: DC

## 2024-08-04 MED ORDER — PHENYLEPHRINE 80 MCG/ML (10ML) SYRINGE FOR IV PUSH (FOR BLOOD PRESSURE SUPPORT)
80.0000 ug | PREFILLED_SYRINGE | INTRAVENOUS | Status: DC | PRN
  Filled 2024-08-04: qty 10

## 2024-08-04 MED ORDER — PHENYLEPHRINE 80 MCG/ML (10ML) SYRINGE FOR IV PUSH (FOR BLOOD PRESSURE SUPPORT): 80.0000 ug | PREFILLED_SYRINGE | INTRAVENOUS | Status: DC | PRN

## 2024-08-04 NOTE — H&P (Signed)
 Carly Atkins is a 28 y.o. female G1PO at [redacted]w[redacted]d presenting for IOL for cHTN.  Pregnancy complicated by:  cHTN - not on meds.  Admit labs: Hgb 10.8, plt 534, Cr 0.52, U o:c 0.2 Multiple elevate BP overnight w/ few SRBP. Started on Procardia  30 mg XL (01:24). BP cuff noted this AM to be inappropriate size. Since changing size BP have been mild range to high mild, but no SRBP Patient asx from pre-e standpoint. If has additional SRBP w/ new BP cuff size, will rule-in for pre-e and initiate MgSo4  Anemia - on PO iron, admit Hgb 10.5 Pre-diabetes - A1c 5.7, 1hr gtt passed  Obese - BMI 43  Patient is doing well. Denies painful contraction, LOF or VB. + FM. Denies HA, VC, CP, SOB or RUQ pain.    OB History     Gravida  1   Para      Term      Preterm      AB      Living         SAB      IAB      Ectopic      Multiple      Live Births             Past Medical History:  Diagnosis Date   Pregnancy induced hypertension    Psoriasis    Recurrent boils    Past Surgical History:  Procedure Laterality Date   WISDOM TOOTH EXTRACTION     Family History: family history includes Diabetes in her father; Hypertension in her mother. Social History:  reports that she quit smoking about 21 months ago. Her smoking use included cigarettes. She started smoking about 4 years ago. She has a 1.6 pack-year smoking history. She has never used smokeless tobacco. She reports current alcohol use. She reports that she does not use drugs.     Maternal Diabetes: No Genetic Screening: Normal Maternal Ultrasounds/Referrals: Normal Fetal Ultrasounds or other Referrals:  None Maternal Substance Abuse:  No Significant Maternal Medications:  None Significant Maternal Lab Results:  Group B Strep positive Number of Prenatal Visits:greater than 3 verified prenatal visits Maternal Vaccinations:TDap   Review of Systems  Constitutional:  Negative for fatigue and fever.  Respiratory:  Negative  for cough, chest tightness and shortness of breath.   Cardiovascular:  Positive for leg swelling. Negative for chest pain and palpitations.  Gastrointestinal:  Negative for diarrhea, nausea and vomiting.  Genitourinary:  Negative for dysuria.  Skin:  Negative for rash.  Neurological:  Negative for light-headedness and headaches.  Psychiatric/Behavioral:  The patient is not nervous/anxious.    Maternal Medical History:  Reason for admission: Nausea.    Dilation: Fingertip Effacement (%): 50 Station: -3 Exam by:: Amy Peterman, RN Blood pressure (!) 157/104, pulse (!) 106, temperature 98.7 F (37.1 C), temperature source Oral, resp. rate 18, height 5' 4 (1.626 m), weight 115.5 kg, last menstrual period 10/12/2023. Exam Physical Exam Constitutional:      Appearance: Normal appearance.  HENT:     Head: Normocephalic.     Mouth/Throat:     Mouth: Mucous membranes are moist.  Eyes:     Pupils: Pupils are equal, round, and reactive to light.  Cardiovascular:     Rate and Rhythm: Normal rate.  Pulmonary:     Effort: Pulmonary effort is normal.  Abdominal:     Comments: Gravid, non-tender  Musculoskeletal:        General: Normal range of  motion.     Left lower leg: Edema present.  Skin:    General: Skin is warm.  Neurological:     General: No focal deficit present.     Mental Status: She is alert and oriented to person, place, and time.  Psychiatric:        Mood and Affect: Mood normal.        Behavior: Behavior normal.     Prenatal labs: ABO, Rh: --/--/B POS (12/23 2331) Antibody: NEG (12/23 2331) Rubella: Immune (06/24 0000) RPR: NON REACTIVE (12/23 2332)  HBsAg: Negative (06/24 0000)  HIV: Non-reactive (06/24 0000)  GBS: Positive/-- (12/12 0000)   Assessment/Plan: 28 y.o. G1PO at [redacted]w[redacted]d admitted for IOL for cHTN.   cHTN - not on meds antepartum Admit labs: Hgb 10.8, plt 534, Cr 0.52, U o:c 0.2 Multiple elevate BP overnight w/ few SRBP. Started on Procardia  30 mg  XL (01:24). BP cuff noted this AM to be inappropriate size. Since changing size BP have been mild range to high mild, but no SRBP Plan:Patient asx from pre-e standpoint. If has additional SRBP w/ new BP cuff size, will rule-in for pre-e and initiate MgSo4 . Continue on Procardia  30 mg. Q12hr CBC/CMP  Labor: s/p Cytotec  x 2, recheck at 9:30 and consider cooks catheter Pain: IV medication until active labor, epidural PRN GBS +, PCN for prophylaxis  Female fetus - desire circ prior to discharge.    Carly Atkins 08/04/2024, 7:21 AM

## 2024-08-04 NOTE — Anesthesia Procedure Notes (Signed)
 Epidural Patient location during procedure: OB Start time: 08/04/2024 8:05 PM End time: 08/04/2024 8:15 PM  Staffing Anesthesiologist: Niels Marien CROME, MD Performed: anesthesiologist   Preanesthetic Checklist Completed: patient identified, IV checked, risks and benefits discussed, monitors and equipment checked, pre-op evaluation and timeout performed  Epidural Patient position: sitting Prep: DuraPrep and site prepped and draped Patient monitoring: continuous pulse ox, blood pressure, heart rate and cardiac monitor Approach: midline Location: L3-L4 Injection technique: LOR air  Needle:  Needle type: Tuohy  Needle gauge: 17 G Needle length: 9 cm Needle insertion depth: 8 cm Catheter type: closed end flexible Catheter size: 19 Gauge Catheter at skin depth: 14 cm Test dose: negative  Assessment Sensory level: T8 Events: blood not aspirated, no cerebrospinal fluid, injection not painful, no injection resistance, no paresthesia and negative IV test  Additional Notes Patient identified. Risks/Benefits/Options discussed with patient including but not limited to bleeding, infection, nerve damage, paralysis, failed block, incomplete pain control, headache, blood pressure changes, nausea, vomiting, reactions to medication both or allergic, itching and postpartum back pain. Confirmed with bedside nurse the patient's most recent platelet count. Confirmed with patient that they are not currently taking any anticoagulation, have any bleeding history or any family history of bleeding disorders. Patient expressed understanding and wished to proceed. All questions were answered. Sterile technique was used throughout the entire procedure. Please see nursing notes for vital signs. Test dose was given through epidural catheter and negative prior to continuing to dose epidural or start infusion. Warning signs of high block given to the patient including shortness of breath, tingling/numbness in  hands, complete motor block, or any concerning symptoms with instructions to call for help. Patient was given instructions on fall risk and not to get out of bed. All questions and concerns addressed with instructions to call with any issues or inadequate analgesia.  Reason for block:procedure for pain

## 2024-08-04 NOTE — Progress Notes (Signed)
 Carly Atkins is a 28 y.o. G1P0 at [redacted]w[redacted]d  admitted for IOL for cHTN, not on meds  Subjective: Patient notes mild pain with contractions. Denies LOF or VB. + FM. Denies HA, VC, CP or SOB   Objective: BP (!) 156/102   Pulse (!) 104   Temp 98.8 F (37.1 C) (Oral)   Resp 18   Ht 5' 4 (1.626 m)   Wt 115.5 kg   LMP 10/12/2023 (Exact Date)   BMI 43.70 kg/m  No intake/output data recorded. Total I/O In: -  Out: 250 [Urine:250]  FHT: FHR 130, moderate variability, accelerations +, no deceleration. Toco: q2-3 Cat 1   SVE:   Dilation: 1 Effacement (%): 50 Station: -3 Exam by:: Camilo Mander MD  Labs: Lab Results  Component Value Date   WBC 11.2 (H) 08/03/2024   HGB 10.8 (L) 08/03/2024   HCT 33.2 (L) 08/03/2024   MCV 86.2 08/03/2024   PLT 524 (H) 08/03/2024    Assessment / Plan:  28 y.o. G1P0 at [redacted]w[redacted]d undergoing or IOL for cHTN, not on meds   cHTN - Admit labs: Hgb 10.8, plt 534, Cr 0.52, U o:c 0.2 Multiple elevate BP overnight w/ few SRBP. Started on Procardia  30 mg XL (01:24). BP cuff noted this AM to be inappropriate size. Since changing size BP have been mild range to high mild, but no SRBP Patient asx from pre-e standpoint. If has additional SRBP w/ new BP cuff size, will rule-in for pre-e and initiate MgSo4   Labor: s/p cytotec  x 2 and cooks placed at 09:30, pitocin  ordered Fetal Wellbeing:  Category I Pain Control:  IV pain meds until active labor, epidural PRN   Charmaine CHRISTELLA Oz, MD 08/04/2024, 10:08 AM

## 2024-08-04 NOTE — Progress Notes (Signed)
 Carly Atkins is a 28 y.o. G1P0 at [redacted]w[redacted]d  admitted for IOL for cHTN, not on meds  Subjective: Patient notes mild pain with contractions. Denies LOF or VB. + FM. Denies HA, VC, CP or SOB   Objective: BP 136/82   Pulse (!) 107   Temp 98.8 F (37.1 C) (Oral)   Resp 18   Ht 5' 4 (1.626 m)   Wt 115.5 kg   LMP 10/12/2023 (Exact Date)   BMI 43.70 kg/m  No intake/output data recorded. Total I/O In: -  Out: 250 [Urine:250]  FHT: FHR 135, moderate variability, accelerations +, no deceleration. Toco: not tracing well on her side Cat 1   SVE:   Dilation: 3 Effacement (%): 60 Station: -3 Exam by:: Niva Murren MD  Labs: Lab Results  Component Value Date   WBC 10.4 08/04/2024   HGB 10.1 (L) 08/04/2024   HCT 31.5 (L) 08/04/2024   MCV 86.5 08/04/2024   PLT 484 (H) 08/04/2024    Assessment / Plan:  28 y.o. G1P0 at [redacted]w[redacted]d undergoing or IOL for cHTN, not on meds   cHTN - Admit labs: Hgb 10.8, plt 534, Cr 0.52, U o:c 0.2 Multiple elevate BP overnight w/ few SRBP. Started on Procardia  30 mg XL (01:24). BP cuff noted this AM to be inappropriate size. Since changing size BP have been mild range to high mild, but no SRBP Patient asx from pre-e standpoint. If has additional SRBP w/ new BP cuff size, will rule-in for pre-e and initiate MgSo4    Labor: s/p cytotec  x 2 and cooks placed at 09:30, checked at 18:10 and still in place - about 3 cm. Will start pitocin  Fetal Wellbeing:  Category I Pain Control:  IV pain meds until active labor, epidural PRN   Charmaine CHRISTELLA Oz, MD 08/04/2024, 6:16 PM

## 2024-08-04 NOTE — Anesthesia Preprocedure Evaluation (Signed)
"                                    Anesthesia Evaluation  Patient identified by MRN, date of birth, ID band Patient awake    Reviewed: Allergy & Precautions, NPO status , Patient's Chart, lab work & pertinent test results  Airway Mallampati: III  TM Distance: >3 FB Neck ROM: Full    Dental no notable dental hx.    Pulmonary neg pulmonary ROS, former smoker   Pulmonary exam normal breath sounds clear to auscultation       Cardiovascular hypertension, negative cardio ROS Normal cardiovascular exam Rhythm:Regular Rate:Normal     Neuro/Psych negative neurological ROS  negative psych ROS   GI/Hepatic negative GI ROS, Neg liver ROS,,,  Endo/Other    Class 3 obesity (BMI 44)  Renal/GU negative Renal ROS  negative genitourinary   Musculoskeletal negative musculoskeletal ROS (+)    Abdominal   Peds  Hematology negative hematology ROS (+)   Anesthesia Other Findings IOL for cHTN  Reproductive/Obstetrics (+) Pregnancy                              Anesthesia Physical Anesthesia Plan  ASA: 3  Anesthesia Plan: Epidural   Post-op Pain Management:    Induction:   PONV Risk Score and Plan: Treatment may vary due to age or medical condition  Airway Management Planned: Natural Airway  Additional Equipment:   Intra-op Plan:   Post-operative Plan:   Informed Consent: I have reviewed the patients History and Physical, chart, labs and discussed the procedure including the risks, benefits and alternatives for the proposed anesthesia with the patient or authorized representative who has indicated his/her understanding and acceptance.       Plan Discussed with: Anesthesiologist  Anesthesia Plan Comments: (Patient identified. Risks, benefits, options discussed with patient including but not limited to bleeding, infection, nerve damage, paralysis, failed block, incomplete pain control, headache, blood pressure changes, nausea,  vomiting, reactions to medication, itching, and post partum back pain. Confirmed with bedside nurse the patient's most recent platelet count. Confirmed with the patient that they are not taking any anticoagulation, have any bleeding history or any family history of bleeding disorders. Patient expressed understanding and wishes to proceed. All questions were answered. )        Anesthesia Quick Evaluation  "

## 2024-08-04 NOTE — Progress Notes (Signed)
 Carly Atkins is a 28 y.o. G1P0 at [redacted]w[redacted]d  admitted for IOL for cHTN, not on meds  Subjective: Comfortable w/ epidural. Denies LOF or VB. + FM.   Objective: BP 138/82   Pulse 100   Temp 97.8 F (36.6 C) (Oral)   Resp 18   Ht 5' 4 (1.626 m)   Wt 115.5 kg   LMP 10/12/2023 (Exact Date)   SpO2 100%   BMI 43.70 kg/m  I/O last 3 completed shifts: In: -  Out: 250 [Urine:250] No intake/output data recorded.  FHT: FHR 130, moderate variability, accelerations +, no deceleration. Toco: not tracing well on her side, palpated q2-3 minutes  Cat 1   SVE:   Dilation: 5 Effacement (%): 50 Station: -3 Exam by:: [employer] Soto-Perez RN  Labs: Lab Results  Component Value Date   WBC 10.4 08/04/2024   HGB 10.1 (L) 08/04/2024   HCT 31.5 (L) 08/04/2024   MCV 86.5 08/04/2024   PLT 484 (H) 08/04/2024    Assessment / Plan:  28 y.o. G1P0 at [redacted]w[redacted]d undergoing or IOL for cHTN, not on meds   cHTN - Recent labs (11:21): Hgb 10.1, plt 484, Cr 0.55 Multiple elevate BP overnight (12/22) w/ few SRBP. Started on Procardia  30 mg XL (01:24). BP cuff noted this AM  (12/24) to be inappropriate size. Since changing size BP have improved  Patient asx from pre-e standpoint. If has additional SRBP w/ new BP cuff size, will rule-in for pre-e and initiate MgSo4    Labor: s/p cytotec  x 2, s/p cooks, pitocin  since 18:20 on 4 millli-units - discussed increasing by 2 q30 minutes. Recheck in 4hr and consider IUPC at that time Fetal Wellbeing:  Category I Pain Control:  epidural in place   Charmaine CHRISTELLA Oz, MD 08/04/2024, 11:08 PM

## 2024-08-05 ENCOUNTER — Encounter (HOSPITAL_COMMUNITY): Payer: Self-pay | Admitting: Obstetrics

## 2024-08-05 ENCOUNTER — Encounter (HOSPITAL_COMMUNITY): Admission: RE | Disposition: A | Discharge status: Home / Self Care | Payer: Self-pay | Source: Home / Self Care

## 2024-08-05 DIAGNOSIS — O1002 Pre-existing essential hypertension complicating childbirth: Secondary | ICD-10-CM | POA: Diagnosis not present

## 2024-08-05 DIAGNOSIS — Z3A38 38 weeks gestation of pregnancy: Secondary | ICD-10-CM | POA: Diagnosis not present

## 2024-08-05 DIAGNOSIS — O99214 Obesity complicating childbirth: Secondary | ICD-10-CM | POA: Diagnosis not present

## 2024-08-05 LAB — CBC
HCT: 33.4 % — ABNORMAL LOW (ref 36.0–46.0)
Hemoglobin: 10.8 g/dL — ABNORMAL LOW (ref 12.0–15.0)
MCH: 28 pg (ref 26.0–34.0)
MCHC: 32.3 g/dL (ref 30.0–36.0)
MCV: 86.5 fL (ref 80.0–100.0)
Platelets: 483 K/uL — ABNORMAL HIGH (ref 150–400)
RBC: 3.86 MIL/uL — ABNORMAL LOW (ref 3.87–5.11)
RDW: 15 % (ref 11.5–15.5)
WBC: 14.9 K/uL — ABNORMAL HIGH (ref 4.0–10.5)
nRBC: 0 % (ref 0.0–0.2)

## 2024-08-05 LAB — COMPREHENSIVE METABOLIC PANEL WITH GFR
ALT: 8 U/L (ref 0–44)
AST: 15 U/L (ref 15–41)
Albumin: 2.9 g/dL — ABNORMAL LOW (ref 3.5–5.0)
Alkaline Phosphatase: 184 U/L — ABNORMAL HIGH (ref 38–126)
Anion gap: 11 (ref 5–15)
BUN: <5 mg/dL — ABNORMAL LOW (ref 6–20)
CO2: 19 mmol/L — ABNORMAL LOW (ref 22–32)
Calcium: 8.9 mg/dL (ref 8.9–10.3)
Chloride: 101 mmol/L (ref 98–111)
Creatinine, Ser: 0.48 mg/dL (ref 0.44–1.00)
GFR, Estimated: >60 mL/min
Glucose, Bld: 80 mg/dL (ref 70–99)
Potassium: 3.5 mmol/L (ref 3.5–5.1)
Sodium: 131 mmol/L — ABNORMAL LOW (ref 135–145)
Total Bilirubin: 0.4 mg/dL (ref 0.0–1.2)
Total Protein: 7.1 g/dL (ref 6.5–8.1)

## 2024-08-05 LAB — CREATININE, SERUM
Creatinine, Ser: 0.56 mg/dL (ref 0.44–1.00)
GFR, Estimated: >60 mL/min

## 2024-08-05 SURGERY — Surgical Case
Anesthesia: Epidural

## 2024-08-05 MED ORDER — SCOPOLAMINE 1 MG/3DAYS TD PT72
1.0000 | MEDICATED_PATCH | Freq: Once | TRANSDERMAL | Status: DC
  Administered 2024-08-05: 1 mg via TRANSDERMAL

## 2024-08-05 MED ORDER — KETOROLAC TROMETHAMINE 30 MG/ML IJ SOLN: 30.0000 mg | Freq: Four times a day (QID) | INTRAMUSCULAR | Status: AC | PRN

## 2024-08-05 MED ORDER — NIFEDIPINE ER OSMOTIC RELEASE 30 MG PO TB24
30.0000 mg | ORAL_TABLET | Freq: Every day | ORAL | Status: DC
  Administered 2024-08-05: 30 mg via ORAL
  Filled 2024-08-05: qty 1

## 2024-08-05 MED ORDER — GABAPENTIN 100 MG PO CAPS: 100.0000 mg | ORAL_CAPSULE | Freq: Two times a day (BID) | ORAL | Status: DC | PRN

## 2024-08-05 MED ORDER — KETOROLAC TROMETHAMINE 30 MG/ML IJ SOLN
30.0000 mg | Freq: Four times a day (QID) | INTRAMUSCULAR | Status: AC
  Administered 2024-08-05 – 2024-08-06 (×4): 30 mg via INTRAVENOUS
  Filled 2024-08-05 (×4): qty 1

## 2024-08-05 MED ORDER — OXYCODONE HCL 5 MG PO TABS
5.0000 mg | ORAL_TABLET | ORAL | Status: DC | PRN
  Administered 2024-08-06: 5 mg via ORAL
  Filled 2024-08-05: qty 1

## 2024-08-05 MED ORDER — SCOPOLAMINE 1 MG/3DAYS TD PT72
MEDICATED_PATCH | TRANSDERMAL | Status: AC
  Filled 2024-08-05: qty 1

## 2024-08-05 MED ORDER — SODIUM CHLORIDE 0.9% FLUSH: 3.0000 mL | INTRAVENOUS | Status: DC | PRN

## 2024-08-05 MED ORDER — DIPHENHYDRAMINE HCL 25 MG PO CAPS: 25.0000 mg | ORAL_CAPSULE | ORAL | Status: DC | PRN

## 2024-08-05 MED ORDER — FENTANYL CITRATE (PF) 100 MCG/2ML IJ SOLN
INTRAMUSCULAR | Status: DC | PRN
  Administered 2024-08-05: 100 ug via EPIDURAL

## 2024-08-05 MED ORDER — LIDOCAINE-EPINEPHRINE (PF) 2 %-1:200000 IJ SOLN
INTRAMUSCULAR | Status: DC | PRN
  Administered 2024-08-05: 5 mL via EPIDURAL
  Administered 2024-08-05: 3 mL via EPIDURAL
  Administered 2024-08-05: 5 mL via EPIDURAL

## 2024-08-05 MED ORDER — COCONUT OIL OIL: 1.0000 | TOPICAL_OIL | Status: DC | PRN

## 2024-08-05 MED ORDER — WITCH HAZEL-GLYCERIN EX PADS: 1.0000 | MEDICATED_PAD | CUTANEOUS | Status: DC | PRN

## 2024-08-05 MED ORDER — OXYTOCIN-SODIUM CHLORIDE 30-0.9 UT/500ML-% IV SOLN
INTRAVENOUS | Status: DC | PRN
  Administered 2024-08-05: 30 [IU] via INTRAVENOUS

## 2024-08-05 MED ORDER — DIBUCAINE (PERIANAL) 1 % EX OINT: 1.0000 | TOPICAL_OINTMENT | CUTANEOUS | Status: DC | PRN

## 2024-08-05 MED ORDER — LACTATED RINGERS IV SOLN: INTRAVENOUS | Status: DC | PRN

## 2024-08-05 MED ORDER — NALOXONE HCL 0.4 MG/ML IJ SOLN: 0.4000 mg | INTRAMUSCULAR | Status: DC | PRN

## 2024-08-05 MED ORDER — PHENYLEPHRINE HCL-NACL 20-0.9 MG/250ML-% IV SOLN
INTRAVENOUS | Status: DC | PRN
  Administered 2024-08-05 (×2): 80 ug/min via INTRAVENOUS

## 2024-08-05 MED ORDER — MORPHINE SULFATE (PF) 0.5 MG/ML IJ SOLN
INTRAMUSCULAR | Status: DC | PRN
  Administered 2024-08-05: 3 mg via EPIDURAL

## 2024-08-05 MED ORDER — DEXAMETHASONE SOD PHOSPHATE PF 10 MG/ML IJ SOLN
INTRAMUSCULAR | Status: DC | PRN
  Administered 2024-08-05: 10 mg via INTRAVENOUS

## 2024-08-05 MED ORDER — KETOROLAC TROMETHAMINE 30 MG/ML IJ SOLN: 30.0000 mg | Freq: Once | INTRAMUSCULAR | Status: DC | PRN

## 2024-08-05 MED ORDER — LACTATED RINGERS IV SOLN: INTRAVENOUS | Status: DC

## 2024-08-05 MED ORDER — NALOXONE HCL 4 MG/10ML IJ SOLN: 1.0000 ug/kg/h | INTRAVENOUS | Status: DC | PRN

## 2024-08-05 MED ORDER — FENTANYL CITRATE (PF) 100 MCG/2ML IJ SOLN
INTRAMUSCULAR | Status: AC
  Filled 2024-08-05: qty 2

## 2024-08-05 MED ORDER — KETOROLAC TROMETHAMINE 30 MG/ML IJ SOLN
INTRAMUSCULAR | Status: DC | PRN
  Administered 2024-08-05: 30 mg via INTRAVENOUS

## 2024-08-05 MED ORDER — CEFAZOLIN SODIUM-DEXTROSE 2-4 GM/100ML-% IV SOLN: 2.0000 g | Freq: Once | INTRAVENOUS | Status: DC

## 2024-08-05 MED ORDER — DIPHENHYDRAMINE HCL 25 MG PO CAPS: 25.0000 mg | ORAL_CAPSULE | Freq: Four times a day (QID) | ORAL | Status: DC | PRN

## 2024-08-05 MED ORDER — OXYCODONE HCL 5 MG/5ML PO SOLN: 5.0000 mg | Freq: Once | ORAL | Status: DC | PRN

## 2024-08-05 MED ORDER — SIMETHICONE 80 MG PO CHEW
80.0000 mg | CHEWABLE_TABLET | Freq: Three times a day (TID) | ORAL | Status: DC
  Administered 2024-08-05 – 2024-08-07 (×4): 80 mg via ORAL
  Filled 2024-08-05 (×5): qty 1

## 2024-08-05 MED ORDER — OXYCODONE HCL 5 MG PO TABS: 5.0000 mg | ORAL_TABLET | Freq: Once | ORAL | Status: DC | PRN

## 2024-08-05 MED ORDER — MENTHOL 3 MG MT LOZG: 1.0000 | LOZENGE | OROMUCOSAL | Status: DC | PRN

## 2024-08-05 MED ORDER — FERROUS SULFATE 325 (65 FE) MG PO TABS
325.0000 mg | ORAL_TABLET | Freq: Every day | ORAL | Status: DC
  Administered 2024-08-05 – 2024-08-07 (×3): 325 mg via ORAL
  Filled 2024-08-05 (×3): qty 1

## 2024-08-05 MED ORDER — FENTANYL CITRATE (PF) 100 MCG/2ML IJ SOLN: 25.0000 ug | INTRAMUSCULAR | Status: DC | PRN

## 2024-08-05 MED ORDER — SOD CITRATE-CITRIC ACID 500-334 MG/5ML PO SOLN
ORAL | Status: AC
  Filled 2024-08-05: qty 30

## 2024-08-05 MED ORDER — FERROUS SULFATE 325 (65 FE) MG PO TBEC: 325.0000 mg | DELAYED_RELEASE_TABLET | Freq: Three times a day (TID) | ORAL | Status: DC

## 2024-08-05 MED ORDER — IBUPROFEN 600 MG PO TABS
600.0000 mg | ORAL_TABLET | Freq: Four times a day (QID) | ORAL | Status: DC
  Administered 2024-08-06 – 2024-08-07 (×5): 600 mg via ORAL
  Filled 2024-08-05 (×5): qty 1

## 2024-08-05 MED ORDER — CEFAZOLIN SODIUM-DEXTROSE 2-3 GM-%(50ML) IV SOLR
INTRAVENOUS | Status: DC | PRN
  Administered 2024-08-05: 2 g via INTRAVENOUS

## 2024-08-05 MED ORDER — MORPHINE SULFATE (PF) 0.5 MG/ML IJ SOLN
INTRAMUSCULAR | Status: AC
  Filled 2024-08-05: qty 10

## 2024-08-05 MED ORDER — DIPHENHYDRAMINE HCL 50 MG/ML IJ SOLN: 12.5000 mg | INTRAMUSCULAR | Status: DC | PRN

## 2024-08-05 MED ORDER — LACTATED RINGERS IV BOLUS
500.0000 mL | INTRAVENOUS | Status: DC | PRN
  Administered 2024-08-05: 500 mL via INTRAVENOUS

## 2024-08-05 MED ORDER — ENOXAPARIN SODIUM 60 MG/0.6ML IJ SOSY
60.0000 mg | PREFILLED_SYRINGE | INTRAMUSCULAR | Status: DC
  Administered 2024-08-05 – 2024-08-06 (×2): 60 mg via SUBCUTANEOUS
  Filled 2024-08-05 (×2): qty 0.6

## 2024-08-05 MED ORDER — ZOLPIDEM TARTRATE 5 MG PO TABS: 5.0000 mg | ORAL_TABLET | Freq: Every evening | ORAL | Status: DC | PRN

## 2024-08-05 MED ORDER — OXYTOCIN-SODIUM CHLORIDE 30-0.9 UT/500ML-% IV SOLN: 2.5000 [IU]/h | INTRAVENOUS | Status: AC

## 2024-08-05 MED ORDER — ONDANSETRON HCL 4 MG/2ML IJ SOLN
INTRAMUSCULAR | Status: DC | PRN
  Administered 2024-08-05: 4 mg via INTRAVENOUS

## 2024-08-05 MED ORDER — SENNOSIDES-DOCUSATE SODIUM 8.6-50 MG PO TABS
2.0000 | ORAL_TABLET | ORAL | Status: DC
  Administered 2024-08-06 – 2024-08-07 (×2): 2 via ORAL
  Filled 2024-08-05 (×2): qty 2

## 2024-08-05 MED ORDER — SIMETHICONE 80 MG PO CHEW: 80.0000 mg | CHEWABLE_TABLET | ORAL | Status: DC | PRN

## 2024-08-05 MED ORDER — TRANEXAMIC ACID-NACL 1000-0.7 MG/100ML-% IV SOLN
1000.0000 mg | INTRAVENOUS | Status: AC
  Administered 2024-08-05: 1000 mg via INTRAVENOUS

## 2024-08-05 MED ORDER — PRENATAL MULTIVITAMIN CH
1.0000 | ORAL_TABLET | Freq: Every day | ORAL | Status: DC
  Administered 2024-08-05 – 2024-08-07 (×3): 1 via ORAL
  Filled 2024-08-05 (×3): qty 1

## 2024-08-05 MED ORDER — ACETAMINOPHEN 500 MG PO TABS
1000.0000 mg | ORAL_TABLET | Freq: Four times a day (QID) | ORAL | Status: AC
  Administered 2024-08-05 (×3): 1000 mg via ORAL
  Filled 2024-08-05 (×4): qty 2

## 2024-08-05 MED ORDER — SODIUM CHLORIDE 0.9 % IV SOLN
500.0000 mg | Freq: Once | INTRAVENOUS | Status: AC
  Administered 2024-08-05: 500 mg via INTRAVENOUS

## 2024-08-05 MED ORDER — ACETAMINOPHEN 10 MG/ML IV SOLN
INTRAVENOUS | Status: DC | PRN
  Administered 2024-08-05: 1000 mg via INTRAVENOUS

## 2024-08-05 SURGICAL SUPPLY — 29 items
CHLORAPREP W/TINT 26 (MISCELLANEOUS) ×2 IMPLANT
CLAMP UMBILICAL CORD (MISCELLANEOUS) ×1 IMPLANT
CLOTH BEACON ORANGE TIMEOUT ST (SAFETY) ×1 IMPLANT
DERMABOND ADVANCED .7 DNX12 (GAUZE/BANDAGES/DRESSINGS) IMPLANT
DRSG OPSITE POSTOP 4X10 (GAUZE/BANDAGES/DRESSINGS) ×1 IMPLANT
ELECTRODE REM PT RTRN 9FT ADLT (ELECTROSURGICAL) ×1 IMPLANT
EXTRACTOR VACUUM KIWI (MISCELLANEOUS) IMPLANT
GLOVE BIO SURGEON STRL SZ 6 (GLOVE) ×1 IMPLANT
GLOVE BIOGEL PI IND STRL 6 (GLOVE) ×1 IMPLANT
GOWN STRL REUS W/TWL LRG LVL3 (GOWN DISPOSABLE) ×2 IMPLANT
KIT ABG SYR 3ML LUER SLIP (SYRINGE) IMPLANT
MAT PREVALON FULL STRYKER (MISCELLANEOUS) IMPLANT
NDL HYPO 25X5/8 SAFETYGLIDE (NEEDLE) IMPLANT
NEEDLE HYPO 22GX1.5 SAFETY (NEEDLE) IMPLANT
NEEDLE HYPO 25X5/8 SAFETYGLIDE (NEEDLE) IMPLANT
NS IRRIG 1000ML POUR BTL (IV SOLUTION) ×1 IMPLANT
PACK C SECTION WH (CUSTOM PROCEDURE TRAY) ×1 IMPLANT
PAD OB MATERNITY 4.3X12.25 (PERSONAL CARE ITEMS) ×1 IMPLANT
RTRCTR C-SECT PINK 25CM LRG (MISCELLANEOUS) IMPLANT
SUT MNCRL 0 VIOLET CTX 36 (SUTURE) ×2 IMPLANT
SUT MON AB 4-0 PS1 27 (SUTURE) ×1 IMPLANT
SUT PDS AB 0 CTX 36 PDP370T (SUTURE) ×1 IMPLANT
SUT PDS AB 0 CTX 60 (SUTURE) ×1 IMPLANT
SUT VIC AB 0 CTX36XBRD ANBCTRL (SUTURE) ×1 IMPLANT
SUT VICRYL+ 3-0 36IN CT-1 (SUTURE) ×1 IMPLANT
SYR 30ML LL (SYRINGE) IMPLANT
TOWEL OR 17X24 6PK STRL BLUE (TOWEL DISPOSABLE) ×1 IMPLANT
TRAY FOLEY W/BAG SLVR 14FR LF (SET/KITS/TRAYS/PACK) ×1 IMPLANT
WATER STERILE IRR 1000ML POUR (IV SOLUTION) ×1 IMPLANT

## 2024-08-05 NOTE — Transfer of Care (Signed)
 Immediate Anesthesia Transfer of Care Note  Patient: Carly Atkins  Procedure(s) Performed: CESAREAN DELIVERY  Patient Location: PACU  Anesthesia Type:Epidural  Level of Consciousness: awake, alert , and oriented  Airway & Oxygen Therapy: Patient Spontanous Breathing  Post-op Assessment: Report given to RN and Post -op Vital signs reviewed and stable  Post vital signs: Reviewed and stable  Last Vitals:  Vitals Value Taken Time  BP 99/48 08/05/24 06:04  Temp    Pulse 112 08/05/24 06:11  Resp 22 08/05/24 06:11  SpO2 96 % 08/05/24 06:11  Vitals shown include unfiled device data.  Last Pain:  Vitals:   08/05/24 0426  TempSrc:   PainSc: 0-No pain      Patients Stated Pain Goal: 0 (08/04/24 1058)  Complications: No notable events documented.

## 2024-08-05 NOTE — Progress Notes (Signed)
 Carly Atkins is a 28 y.o. G1P0 at [redacted]w[redacted]d  admitted for IOL for cHTN, not on meds  Subjective: Comfortable w/ epidural. Continued LOF. Denies VB. + FM.   Objective: BP 122/80   Pulse (!) 123   Temp 98.1 F (36.7 C) (Oral)   Resp 16   Ht 5' 4 (1.626 m)   Wt 115.5 kg   LMP 10/12/2023 (Exact Date)   SpO2 100%   BMI 43.70 kg/m  I/O last 3 completed shifts: In: -  Out: 250 [Urine:250] No intake/output data recorded.  FHT: FHR 130, moderate variability, accelerations +, intermittent late deceleration. Toco: not tracing well on her side, palpated q2-3 minutes  Cat 2  SVE:   Dilation: 5 Effacement (%): 50 Station: -3 Exam by:: Vandermermeersh, MD  Labs: Lab Results  Component Value Date   WBC 10.4 08/04/2024   HGB 10.1 (L) 08/04/2024   HCT 31.5 (L) 08/04/2024   MCV 86.5 08/04/2024   PLT 484 (H) 08/04/2024    Assessment / Plan:  28 y.o. G1P0 at [redacted]w[redacted]d undergoing or IOL for cHTN, not on meds   cHTN - Recent labs (11:21): Hgb 10.1, plt 484, Cr 0.55 Multiple elevate BP overnight (12/22) w/ few SRBP. Started on Procardia  30 mg XL (01:24). BP cuff noted this AM  (12/24) to be inappropriate size. Since changing size BP have improved  Patient asx from pre-e standpoint. If has additional SRBP w/ new BP cuff size, will rule-in for pre-e and initiate MgSo4    Labor: s/p cytotec  x 2, s/p cooks, pitocin  since 18:20 on 10 millli-units. IUPC placed. Caput present - discussed continued position changes while laboring Fetal Wellbeing:  Category II - late decelerations present, plan to half pitocin , continue position changes, and start fluid bolus, will reassess in 30 minutes and pause pitocin  if needed.  Pain Control:  epidural in place   Charmaine CHRISTELLA Oz, MD 08/05/2024, 3:30 AM

## 2024-08-05 NOTE — Op Note (Addendum)
 Procedure(s): CESAREAN SECTION Procedure Note  Carly Atkins female 28 y.o. 08/05/2024  Procedure(s) and Anesthesia Type:    * CESAREAN SECTION - Regional  Surgeon(s) and Role:    * Charmaine Oz, MD - Primary    * Oliva Maier, DO - Assisting   An experienced assistant was required given the standard of surgical care given the complexity of the case.  This assistant was needed for exposure, dissection, suctioning, retraction, instrument exchange,  assisting with delivery with administration of fundal pressure, and for overall help during the procedure.   Indications: Non-reassuring fetal heart tracing remote from delivery     Anesthesia: Epidural anesthesia   Procedure Detail  CESAREAN SECTION  Findings: Normal uterus, bilateral fallopian tubes, and bilateral ovaries.  Viable female infant with weight 3070 g (6 pounds 12.3 ounces) Apgars 8 and 8.   Estimated Blood Loss:  300 cc         Specimens: Placenta to L&D         Complications:  None         Disposition: PACU - hemodynamically stable.         Condition: stable    Description of Procedure: The patient was taken to the operating room where epidural anesthesia was found to be adequate.  The patient was placed in the dorsal supine position.  Fetal heart tones were confirmed.  The patient was subsequently prepped and draped in the normal sterile fashion.    A low transverse skin incision was made with a scalpel and carried down to the level of the fascia with the scalpel.  The fascia was incised in the midline with the scalpel, the lateral tissue was cleared bluntly and the fascia was divided bluntly with both index fingers inserted in the deep fascial space and extended superior and inferior.  The rectus muscles then were separated in the midline.  The peritoneum was found free of adherent bowel and the peritoneal cavity was entered bluntly.  The uterus was identified and the alexis retractor was placed  intraperitoneal.  A bladder flap was then created sharply with Metzenbaum scissors and separated from the lower uterine segment digitally.   A low transverse hysterotomy was then made with a scalpel.  The infant was found in the cephalic presentation was delivered atraumatically and without difficulty in the usual fashion.  After 60 seconds of delayed cord clamping the cord was clamped and cut and the infant was handed off to the pediatricians.  The placenta was delivered with gentle traction on umbilical cord and manual massage of the uterine fundus.  The uterus was cleared of all clot and debris.  The hysterotomy was then closed with 0 Monocryl. The hysterotomy was inspected adequate hemostasis was noted.  The uterus was returned to the abdominal cavity and hysterotomy was re-inspected and noted to be hemostatic. The Alexis retractor was removed from the abdomen. The fascia was closed with 0 looped PDS suture in a continuous running fashion.  The subcutaneous tissue was irrigated and rendered hemostatic with cautery.  The subcutaneous layer was subsequently closed with 3-0 Vicryl in a continuous running fashion.  The skin was closed with 4-0 Monocryl in a running subcuticular fashion.  Sponge, lap and needle counts were correct. Honeycomb dressing was placed on the incision.   Charmaine CHRISTELLA Oz 08/05/2024 5:43 AM

## 2024-08-05 NOTE — Plan of Care (Signed)
 MD to bedside for recurrent late decelerations. Pitocin  paused, fluid bolus started and position changes had been performed. Recurrent late decelerations persistent despite resuscitative efforts. Discussed with patient recommendation for c/s at this time due to NRFHT remote from delivery. Patient is amenable.  R/B of cesarean section were discussed with patient including bleeding, infection, poor wound healing, injury to surrounding structure (bowel, bladder, ureters), need for hysterectomy in the event of life threatening bleeding.  2g Ancef , Azithromycin  and 1g TXA pre-procedure    Charmaine Oz, MD 07/21/2024 4:10 AM

## 2024-08-06 ENCOUNTER — Encounter (HOSPITAL_COMMUNITY): Payer: Self-pay

## 2024-08-06 LAB — CBC
HCT: 26.6 % — ABNORMAL LOW (ref 36.0–46.0)
Hemoglobin: 8.7 g/dL — ABNORMAL LOW (ref 12.0–15.0)
MCH: 28.3 pg (ref 26.0–34.0)
MCHC: 32.7 g/dL (ref 30.0–36.0)
MCV: 86.6 fL (ref 80.0–100.0)
Platelets: 463 K/uL — ABNORMAL HIGH (ref 150–400)
RBC: 3.07 MIL/uL — ABNORMAL LOW (ref 3.87–5.11)
RDW: 15 % (ref 11.5–15.5)
WBC: 18.1 K/uL — ABNORMAL HIGH (ref 4.0–10.5)
nRBC: 0 % (ref 0.0–0.2)

## 2024-08-06 MED ORDER — NIFEDIPINE ER OSMOTIC RELEASE 30 MG PO TB24
60.0000 mg | ORAL_TABLET | ORAL | Status: DC
  Administered 2024-08-06 – 2024-08-07 (×2): 60 mg via ORAL
  Filled 2024-08-06 (×2): qty 2

## 2024-08-06 MED ORDER — ACETAMINOPHEN 500 MG PO TABS
1000.0000 mg | ORAL_TABLET | Freq: Four times a day (QID) | ORAL | Status: DC
  Administered 2024-08-06 – 2024-08-07 (×5): 1000 mg via ORAL
  Filled 2024-08-06 (×5): qty 2

## 2024-08-06 NOTE — Progress Notes (Addendum)
 POSTPARTUM POSTOP PROGRESS NOTE  POD #1  Subjective:  No acute events overnight.  Pt denies problems with ambulating, voiding or po intake.  She denies nausea or vomiting.  Pain is well controlled.  She has not had flatus. She has not had bowel movement.  Lochia Minimal.   Objective: Blood pressure 127/74, pulse 83, temperature 98.3 F (36.8 C), temperature source Oral, resp. rate 18, height 5' 4 (1.626 m), weight 115.5 kg, last menstrual period 10/12/2023, SpO2 100%, unknown if currently breastfeeding.  Physical Exam:  General: alert, cooperative and no distress Lochia:normal flow Chest: CTAB Heart: RRR no m/r/g Abdomen: +BS, soft, nontender Uterine Fundus: firm, 2cm below umbilicus. Honeycomb dressing intact, minimal drainage Extremities: trace pedal BL edema, neg calf TTP BL, neg Homans BL  Recent Labs    08/05/24 0719 08/06/24 0523  HGB 10.8* 8.7*  HCT 33.4* 26.6*    Assessment/Plan:  ASSESSMENT: Carly Atkins is a 28 y.o. G1P1001 s/p PLTCS @ [redacted]w[redacted]d for NRFS remote from delivery. PNC c/b IOL for CHTN previous not on Rx, BMI 43.   Plan for discharge tomorrow, Lactation consult, and Circumcision prior to discharge CHTN: Previously not on medication during pregnancy, intermittent SRBP in labor which transitioned to intermittent mild elevations. Started on procardia  POD#0 first at 30mg  XL but increased to 60XL every day overnight. Since increase, only one systolic BP 140s. Continue to monitor today. Asx from HTN standpoint Encourage ambulation and incentive spirometry Desires circ for baby boy, planned for today Anticipate DC home POD#2-3 pending postop goals and BP control with plans for BP check/incision check otupatient    LOS: 3 days

## 2024-08-06 NOTE — Anesthesia Postprocedure Evaluation (Signed)
"   Anesthesia Post Note  Patient: Carly Atkins  Procedure(s) Performed: CESAREAN DELIVERY     Patient location during evaluation: PACU Anesthesia Type: Epidural Level of consciousness: oriented and awake and alert Pain management: pain level controlled Vital Signs Assessment: post-procedure vital signs reviewed and stable Respiratory status: spontaneous breathing, respiratory function stable and patient connected to nasal cannula oxygen Cardiovascular status: blood pressure returned to baseline and stable Postop Assessment: no headache, no backache, no apparent nausea or vomiting and epidural receding Anesthetic complications: no   No notable events documented.  Last Vitals:  Vitals:   08/05/24 2152 08/06/24 0535  BP: 136/79 127/74  Pulse: 80 83  Resp: 18 18  Temp: 37.2 C 36.8 C  SpO2:  100%    Last Pain:  Vitals:   08/06/24 1045  TempSrc:   PainSc: 0-No pain   Pain Goal: Patients Stated Pain Goal: 0 (08/04/24 1058)                 Tomiko Schoon L Charlina Dwight      "

## 2024-08-07 MED ORDER — NIFEDIPINE ER OSMOTIC RELEASE 60 MG PO TB24: 60.0000 mg | ORAL_TABLET | Freq: Every day | ORAL | 1 refills | Status: AC

## 2024-08-07 MED ORDER — NIFEDIPINE ER 60 MG PO TB24: 60.0000 mg | ORAL_TABLET | Freq: Every day | ORAL | 1 refills | Status: DC

## 2024-08-07 MED ORDER — OXYCODONE HCL 5 MG PO TABS: 5.0000 mg | ORAL_TABLET | ORAL | 0 refills | Status: AC | PRN

## 2024-08-07 MED ORDER — IBUPROFEN 600 MG PO TABS: 600.0000 mg | ORAL_TABLET | Freq: Four times a day (QID) | ORAL | 0 refills | Status: AC | PRN

## 2024-08-07 NOTE — Discharge Summary (Signed)
 "    Postpartum Discharge Summary       Patient Name: Carly Atkins DOB: Jul 29, 1996 MRN: [id number]  Date of admission: 08/03/2024 Delivery date:08/05/2024 Delivering provider: LANE CHARMAINE HERO Date of discharge: 08/07/2024  Admitting diagnosis: Benign essential hypertension antepartum in third trimester [O10.013] Intrauterine pregnancy: [redacted]w[redacted]d     Secondary diagnosis:  Principal Problem:   Benign essential hypertension antepartum in third trimester  Additional problems: Maternal BMI 43, hidradenitis suppurativa    Discharge diagnosis: Term Pregnancy Delivered                                              Post partum procedures:NA Augmentation: AROM, Pitocin , Cytotec , and IP Foley Complications: None  Hospital course: Induction of Labor With Cesarean Section   28 y.o. yo G1P1001 at [redacted]w[redacted]d was admitted to the hospital 08/03/2024 for induction of labor. Patient had a labor course significant for non-reassuring fetal well being. The patient went for cesarean section due to Non-Reassuring FHR. Delivery details are as follows: Membrane Rupture Time/Date: 11:03 PM,08/04/2024  Delivery Method:C-Section, Low Transverse Operative Delivery:N/A Details of operation can be found in separate operative Note.  Patient had a postpartum course complicated bynothing. She is ambulating, tolerating a regular diet, passing flatus, and urinating well.  Patient is discharged home in stable condition on 08/07/2024.      Newborn Data: Birth date:08/05/2024 Birth time:5:11 AM Gender:Female Living status:Living Apgars:8 ,9  Weight:3070 g                               Magnesium  Sulfate received: No BMZ received: No Rhophylac:N/A Immunizations administered:  There is no immunization history on file for this patient.  Physical exam  Vitals:   08/06/24 0535 08/06/24 1454 08/06/24 2009 08/07/24 0524  BP: 127/74 136/89 138/85 (!) 144/92  Pulse: 83 100 100 91  Resp: 18 18 18 18   Temp: 98.3 F  (36.8 C) 98.2 F (36.8 C) 98.8 F (37.1 C) 98.6 F (37 C)  TempSrc: Oral Oral Oral Oral  SpO2: 100%  100% 100%  Weight:      Height:       General: alert, cooperative, and no distress Lochia: appropriate Uterine Fundus: firm Incision: Healing well with no significant drainage DVT Evaluation: No evidence of DVT seen on physical exam. Labs: Lab Results  Component Value Date   WBC 18.1 (H) 08/06/2024   HGB 8.7 (L) 08/06/2024   HCT 26.6 (L) 08/06/2024   MCV 86.6 08/06/2024   PLT 463 (H) 08/06/2024      Latest Ref Rng & Units 08/05/2024    7:19 AM  CMP  Creatinine 0.44 - 1.00 mg/dL 9.43    Edinburgh Score:    08/06/2024    8:10 PM  Edinburgh Postnatal Depression Scale Screening Tool  I have been able to laugh and see the funny side of things. 0  I have looked forward with enjoyment to things. 0  I have blamed myself unnecessarily when things went wrong. 1  I have been anxious or worried for no good reason. 1  I have felt scared or panicky for no good reason. 0  Things have been getting on top of me. 0  I have been so unhappy that I have had difficulty sleeping. 0  I have felt sad or miserable. 0  I  have been so unhappy that I have been crying. 0  The thought of harming myself has occurred to me. 0  Edinburgh Postnatal Depression Scale Total 2      After visit meds:  Allergies as of 08/07/2024       Reactions   Zofran  [ondansetron  Hcl] Rash        Medication List     STOP taking these medications    aspirin 81 MG chewable tablet   cephALEXin  500 MG capsule Commonly known as: KEFLEX    clindamycin  300 MG capsule Commonly known as: CLEOCIN    ferrous sulfate  325 (65 FE) MG EC tablet   hydrocortisone  cream 1 %   loratadine  10 MG tablet Commonly known as: CLARITIN    triamcinolone  ointment 0.5 % Commonly known as: KENALOG    TriNessa (28) 0.18/0.215/0.25 MG-35 MCG tablet Generic drug: Norgestimate-Ethinyl Estradiol Triphasic       TAKE these  medications    ibuprofen  600 MG tablet Commonly known as: ADVIL  Take 1 tablet (600 mg total) by mouth every 6 (six) hours as needed. What changed:  medication strength how much to take when to take this   multivitamin-prenatal 27-0.8 MG Tabs tablet Take 1 tablet by mouth daily at 12 noon.   oxyCODONE  5 MG immediate release tablet Commonly known as: Roxicodone  Take 1 tablet (5 mg total) by mouth every 4 (four) hours as needed.               Discharge Care Instructions  (From admission, onward)           Start     Ordered   08/07/24 0000  Discharge wound care:       Comments: For a cesarean delivery: You may wash incision with soap and water.  Do not soak or submerge the incision for 2 weeks. Keep incision dry. You may need to keep a sanitary pad or panty liner between the incision and your clothing for comfort and to keep the incision dry. If you note drainage, increased pain, or increased redness of the incision, then please notify your physician.   08/07/24 1300   08/07/24 0000  If the dressing is still on your incision site when you go home, remove it on the third day after your surgery date. Remove dressing if it begins to fall off, or if it is dirty or damaged before the third day.       Comments: For a cesarean delivery   08/07/24 1300             Discharge home in stable condition Infant Feeding: Bottle and Breast Infant Disposition:home with mother Discharge instruction: per After Visit Summary and Postpartum booklet. Activity: Advance as tolerated. Pelvic rest for 6 weeks.  Diet: routine diet Anticipated Birth Control: Unsure Postpartum Appointment:4 weeks Future Appointments:No future appointments. Follow up Visit:  Follow-up Information     Ob/Gyn, Landy Stains Follow up in 4 week(s).   Why: For a postpartum evaluation Contact information: [address] [place] [address] [phone]                      08/07/2024 Marjorie Gull, MD   "

## 2024-08-11 ENCOUNTER — Telehealth (HOSPITAL_COMMUNITY): Payer: Self-pay

## 2024-08-11 NOTE — Telephone Encounter (Signed)
 08/11/2024 1027  Name: Carly Atkins MRN: [id number] DOB: [birth year]  Reason for Call:  Transition of Care Hospital Discharge Call  Contact Status: Patient Contact Status: Unable to contact (Mailbox is full)  Product manager needed:          Follow-Up Questions:    Van Postnatal Depression Scale:  In the Past 7 Days:    PHQ2-9 Depression Scale:     Discharge Follow-up:    Post-discharge interventions: NA  Signature  Rosaline Deretha PEAK
